# Patient Record
Sex: Female | Born: 1950 | Race: White | Hispanic: No | Smoking: Former smoker
Health system: Southern US, Community
[De-identification: ages and names within clinical notes are randomized; demographics above are authoritative.]

## PROBLEM LIST (undated history)

## (undated) DIAGNOSIS — E039 Hypothyroidism, unspecified: Secondary | ICD-10-CM

## (undated) DIAGNOSIS — I10 Essential (primary) hypertension: Secondary | ICD-10-CM

## (undated) HISTORY — DX: Hypothyroidism, unspecified: E03.9

## (undated) HISTORY — DX: Essential (primary) hypertension: I10

## (undated) HISTORY — PX: KNEE CARTILAGE SURGERY: SHX688

## (undated) HISTORY — PX: PARTIAL HYSTERECTOMY: SHX80

---

## 2020-08-29 LAB — TSH: TSH: 29.7 — AB (ref 0.41–5.90)

## 2020-10-20 LAB — TSH: TSH: 20.1 — AB (ref 0.41–5.90)

## 2020-11-21 LAB — HEMOGLOBIN A1C: Hemoglobin A1C: 5.9

## 2020-11-21 LAB — TSH: TSH: 9.65 — AB (ref 0.41–5.90)

## 2020-11-21 NOTE — Patient Instructions (Signed)

## 2020-11-22 ENCOUNTER — Encounter: Payer: Self-pay | Admitting: Nurse Practitioner

## 2020-11-22 ENCOUNTER — Other Ambulatory Visit: Payer: Self-pay

## 2020-11-22 ENCOUNTER — Ambulatory Visit (INDEPENDENT_AMBULATORY_CARE_PROVIDER_SITE_OTHER): Payer: BC Managed Care – PPO | Admitting: Nurse Practitioner

## 2020-11-22 VITALS — BP 138/78 | HR 71 | Ht 66.5 in | Wt 227.4 lb

## 2020-11-22 DIAGNOSIS — E038 Other specified hypothyroidism: Secondary | ICD-10-CM

## 2020-11-22 DIAGNOSIS — E063 Autoimmune thyroiditis: Secondary | ICD-10-CM

## 2020-11-22 MED ORDER — LEVOTHYROXINE SODIUM 75 MCG PO TABS: 75.0000 ug | ORAL_TABLET | Freq: Every day | ORAL | 0 refills | Status: DC

## 2020-11-22 NOTE — Addendum Note (Signed)
Addended by: Dani Gobble on: 11/22/2020 10:44 AM   Modules accepted: Orders

## 2020-11-22 NOTE — Progress Notes (Addendum)
Endocrinology Consult Note                                         11/22/2020, 10:42 AM  Subjective:   Subjective    Debbie Morrison is a 70 y.o.-year-old female patient being seen in consultation for hypothyroidism referred by Tacy Learn, FNP.   Past Medical History:  Diagnosis Date   Hypertension    Hypothyroidism     Past Surgical History:  Procedure Laterality Date   KNEE CARTILAGE SURGERY     PARTIAL HYSTERECTOMY      Social History   Socioeconomic History   Marital status: Divorced    Spouse name: Not on file   Number of children: Not on file   Years of education: Not on file   Highest education level: Not on file  Occupational History   Not on file  Tobacco Use   Smoking status: Former    Types: Cigarettes    Quit date: 06/2015    Years since quitting: 5.4   Smokeless tobacco: Never  Vaping Use   Vaping Use: Never used  Substance and Sexual Activity   Alcohol use: Never   Drug use: Never   Sexual activity: Not on file  Other Topics Concern   Not on file  Social History Narrative   Not on file   Social Determinants of Health   Financial Resource Strain: Not on file  Food Insecurity: Not on file  Transportation Needs: Not on file  Physical Activity: Not on file  Stress: Not on file  Social Connections: Not on file    Family History  Problem Relation Age of Onset   Cancer Mother    Thyroid disease Mother    Heart attack Mother     Outpatient Encounter Medications as of 11/22/2020  Medication Sig   levothyroxine (SYNTHROID) 75 MCG tablet Take 1 tablet (75 mcg total) by mouth daily.   lisinopril (ZESTRIL) 10 MG tablet Take 10 mg by mouth daily.   meloxicam (MOBIC) 15 MG tablet Take 15 mg by mouth daily.   [DISCONTINUED] levothyroxine (SYNTHROID) 50 MCG tablet Take 1 tablet by mouth daily.   No facility-administered encounter medications on file as of 11/22/2020.     ALLERGIES: No Known Allergies VACCINATION STATUS:  There is no immunization history on file for this patient.   HPI   Debbie Morrison  is a patient with the above medical history. she was diagnosed with hypothyroidism at approximate age of 51 years during a routine physical exam with labs, which required subsequent initiation of thyroid hormone replacement. she was given various doses of Levothyroxine, currently on 50 micrograms. she reports compliance to this medication:  Taking it daily on empty stomach  with water, separated by >30 minutes before breakfast and other medications, and by at least 4 hours from calcium, iron, PPIs, multivitamins .  I reviewed patient's thyroid tests:  Lab Results  Component Value Date   TSH 9.65 (A) 11/21/2020   TSH 20.10 (A) 10/20/2020  TSH 29.70 (A) 08/29/2020     Pt describes vague symptoms of : - weight gain - fatigue   Pt denies feeling nodules in neck, hoarseness, dysphagia/odynophagia, SOB with lying down.  she does family history of thyroid disorders in her mother (? Tumors on her thyroid) and brother (hypothyroidism).  ? family history of thyroid cancer in her mother.  No history of radiation therapy to head or neck.  No recent use of iodine supplements.  Denies use of Biotin containing supplements.  I reviewed her chart and she also has a history of pre diabetes.   ROS:  Constitutional: + weight gain (attributed it to quitting smoking), + fatigue, no subjective hyperthermia, no subjective hypothermia Eyes: no blurry vision, no xerophthalmia ENT: no sore throat, no nodules palpated in throat, no dysphagia/odynophagia, no hoarseness Cardiovascular: no chest pain, no SOB, no palpitations, no leg swelling Respiratory: no cough, no SOB Gastrointestinal: no nausea/vomiting/diarrhea Musculoskeletal: no muscle/joint aches Skin: no rashes Neurological: no tremors, no numbness, no tingling, no dizziness Psychiatric: no depression, no  anxiety   Objective:   Objective     BP 138/78   Pulse 71   Ht 5' 6.5" (1.689 m)   Wt 227 lb 6.4 oz (103.1 kg)   BMI 36.15 kg/m  Wt Readings from Last 3 Encounters:  11/22/20 227 lb 6.4 oz (103.1 kg)    BP Readings from Last 3 Encounters:  11/22/20 138/78     Constitutional:  Body mass index is 36.15 kg/m., not in acute distress, normal state of mind Eyes: PERRLA, EOMI, no exophthalmos ENT: moist mucous membranes, mild thyromegaly, no palpable nodules, no cervical lymphadenopathy Cardiovascular: normal precordial activity, RRR, no murmur/rubs/gallops Respiratory:  adequate breathing efforts, no gross chest deformity, Clear to auscultation bilaterally Gastrointestinal: abdomen soft, non-tender, no distension, bowel sounds present Musculoskeletal: no gross deformities, strength intact in all four extremities Skin: moist, warm, no rashes Neurological: no tremor with outstretched hands, deep tendon reflexes normal in BLE.   CMP ( most recent) CMP  No results found for: NA, K, CL, CO2, GLUCOSE, BUN, CREATININE, CALCIUM, PROT, ALBUMIN, AST, ALT, ALKPHOS, BILITOT, GFRNONAA, GFRAA   Diabetic Labs (most recent): Lab Results  Component Value Date   HGBA1C 5.9 11/21/2020     Lipid Panel ( most recent) Lipid Panel  No results found for: CHOL, TRIG, HDL, CHOLHDL, VLDL, LDLCALC, LDLDIRECT, LABVLDL     Lab Results  Component Value Date   TSH 9.65 (A) 11/21/2020   TSH 20.10 (A) 10/20/2020   TSH 29.70 (A) 08/29/2020    10/20/20 Thyroid labs Thyroglobulin antibodies: 1600.6 TPO antibodies: 527 TSH 20.1 FT4-0.66 FT3-2.0   Assessment & Plan:   ASSESSMENT / PLAN:  1. Hypothyroidism r/t Hashimoto's Thyroiditis   Patient with newly diagnosed hypothyroidism, on Levothyroxine therapy. On physical exam, patient does not have gross goiter, thyroid nodules, or neck compression symptoms.  Her antibody testing was positive, indicating the etiology of her thyroid dysfunction as  autoimmune. Based on her previsit thyroid function tests, she will need more thyroid hormone.  She is advised to increase her dose of Levothyroxine to 75 mcg po daily before breakfast.  She was advised the can take 1.5 tabs of her current 50 mcg pills until she depletes her current supply.  - We discussed about correct intake of levothyroxine, at fasting, with water, separated by at least 30 minutes from breakfast, and separated by more than 4 hours from calcium, iron, multivitamins, acid reflux medications (PPIs). -Patient is made aware of  the fact that thyroid hormone replacement is needed for life, dose to be adjusted by periodic monitoring of thyroid function tests.   -Due to family history of ? Thyroid tumors, will obtain thyroid ultrasound to assess baseline thyroid anatomy.    - Time spent with the patient: 45 minutes, of which >50% was spent in obtaining information about her symptoms, reviewing her previous labs, evaluations, and treatments, counseling her about her hypothyroidism, and developing a plan to confirm the diagnosis and long term treatment as necessary. Please refer to "Patient Self Inventory" in the Media tab for reviewed elements of pertinent patient history.  Debbie Morrison participated in the discussions, expressed understanding, and voiced agreement with the above plans.  All questions were answered to her satisfaction. she is encouraged to contact clinic should she have any questions or concerns prior to her return visit.   FOLLOW UP PLAN:  Return in about 2 weeks (around 12/06/2020) for Thyroid follow up, thyroid ultrasound.  Ronny Bacon, [facility] [address] [place], [place] [address] Phone: [phone] Fax: [phone]  11/22/2020, 10:42 AM

## 2020-12-02 ENCOUNTER — Other Ambulatory Visit: Payer: Self-pay

## 2020-12-02 ENCOUNTER — Ambulatory Visit (HOSPITAL_COMMUNITY)
Admission: RE | Admit: 2020-12-02 | Discharge: 2020-12-02 | Disposition: A | Discharge status: Home / Self Care | Payer: BC Managed Care – PPO | Source: Ambulatory Visit | Attending: Nurse Practitioner | Admitting: Nurse Practitioner

## 2020-12-02 DIAGNOSIS — E038 Other specified hypothyroidism: Secondary | ICD-10-CM | POA: Insufficient documentation

## 2020-12-02 DIAGNOSIS — E063 Autoimmune thyroiditis: Secondary | ICD-10-CM | POA: Insufficient documentation

## 2020-12-09 ENCOUNTER — Ambulatory Visit: Payer: BC Managed Care – PPO | Admitting: Nurse Practitioner

## 2020-12-09 ENCOUNTER — Encounter: Payer: Self-pay | Admitting: Nurse Practitioner

## 2020-12-09 VITALS — BP 141/83 | HR 73 | Ht 66.5 in | Wt 223.0 lb

## 2020-12-09 DIAGNOSIS — E038 Other specified hypothyroidism: Secondary | ICD-10-CM

## 2020-12-09 DIAGNOSIS — E063 Autoimmune thyroiditis: Secondary | ICD-10-CM

## 2020-12-09 MED ORDER — LEVOTHYROXINE SODIUM 88 MCG PO TABS: 88.0000 ug | ORAL_TABLET | Freq: Every day | ORAL | 0 refills | Status: DC

## 2020-12-09 NOTE — Patient Instructions (Signed)

## 2020-12-09 NOTE — Progress Notes (Signed)
Endocrinology Follow Up Note                                         12/09/2020, 10:19 AM  Subjective:   Subjective    Debbie Morrison is a 70 y.o.-year-old female patient being seen in follow up after being seen in consultation for hypothyroidism referred by Tacy Learn, FNP.   Past Medical History:  Diagnosis Date   Hypertension    Hypothyroidism     Past Surgical History:  Procedure Laterality Date   KNEE CARTILAGE SURGERY     PARTIAL HYSTERECTOMY      Social History   Socioeconomic History   Marital status: Divorced    Spouse name: Not on file   Number of children: Not on file   Years of education: Not on file   Highest education level: Not on file  Occupational History   Not on file  Tobacco Use   Smoking status: Former    Types: Cigarettes    Quit date: 06/2015    Years since quitting: 5.5   Smokeless tobacco: Never  Vaping Use   Vaping Use: Never used  Substance and Sexual Activity   Alcohol use: Never   Drug use: Never   Sexual activity: Not on file  Other Topics Concern   Not on file  Social History Narrative   Not on file   Social Determinants of Health   Financial Resource Strain: Not on file  Food Insecurity: Not on file  Transportation Needs: Not on file  Physical Activity: Not on file  Stress: Not on file  Social Connections: Not on file    Family History  Problem Relation Age of Onset   Cancer Mother    Thyroid disease Mother    Heart attack Mother     Outpatient Encounter Medications as of 12/09/2020  Medication Sig   levothyroxine (SYNTHROID) 88 MCG tablet Take 1 tablet (88 mcg total) by mouth daily before breakfast.   lisinopril (ZESTRIL) 10 MG tablet Take 10 mg by mouth daily.   meloxicam (MOBIC) 15 MG tablet Take 15 mg by mouth daily.   [DISCONTINUED] levothyroxine (SYNTHROID) 75 MCG tablet Take 1 tablet (75 mcg total) by mouth daily.   No  facility-administered encounter medications on file as of 12/09/2020.    ALLERGIES: No Known Allergies VACCINATION STATUS:  There is no immunization history on file for this patient.   HPI   Debbie Morrison  is a patient with the above medical history. she was diagnosed with hypothyroidism at approximate age of 89 years during a routine physical exam with labs, which required subsequent initiation of thyroid hormone replacement. she was given various doses of Levothyroxine, currently on 50 micrograms. she reports compliance to this medication:  Taking it daily on empty stomach  with water, separated by >30 minutes before breakfast and other medications, and by at least 4 hours from calcium, iron, PPIs, multivitamins .  I reviewed patient's thyroid tests:  Lab Results  Component Value Date  TSH 9.65 (A) 11/21/2020   TSH 20.10 (A) 10/20/2020   TSH 29.70 (A) 08/29/2020     Pt describes vague symptoms of : - weight gain - fatigue   Pt denies feeling nodules in neck, hoarseness, dysphagia/odynophagia, SOB with lying down.  she does family history of thyroid disorders in her mother (? Tumors on her thyroid) and brother (hypothyroidism).  ? family history of thyroid cancer in her mother.  No history of radiation therapy to head or neck.  No recent use of iodine supplements.  Denies use of Biotin containing supplements.  I reviewed her chart and she also has a history of pre diabetes.   ROS:  Constitutional: + weight gain (attributed it to quitting smoking)-has recently lost some she previously gained, + fatigue, no subjective hyperthermia, no subjective hypothermia Eyes: no blurry vision, no xerophthalmia ENT: no sore throat, no nodules palpated in throat, no dysphagia/odynophagia, no hoarseness Cardiovascular: no chest pain, no SOB, no palpitations, no leg swelling Respiratory: no cough, no SOB Gastrointestinal: no nausea/vomiting/diarrhea Musculoskeletal: no muscle/joint  aches Skin: no rashes Neurological: no tremors, no numbness, no tingling, no dizziness Psychiatric: no depression, no anxiety   Objective:   Objective     BP (!) 141/83   Pulse 73   Ht 5' 6.5" (1.689 m)   Wt 223 lb (101.2 kg)   BMI 35.45 kg/m  Wt Readings from Last 3 Encounters:  12/09/20 223 lb (101.2 kg)  11/22/20 227 lb 6.4 oz (103.1 kg)    BP Readings from Last 3 Encounters:  12/09/20 (!) 141/83  11/22/20 138/78      Physical Exam- Limited  Constitutional:  Body mass index is 35.45 kg/m. , not in acute distress, normal state of mind Eyes:  EOMI, no exophthalmos Neck: Supple Cardiovascular: RRR, no murmurs, rubs, or gallops, no edema Respiratory: Adequate breathing efforts, no crackles, rales, rhonchi, or wheezing Musculoskeletal: no gross deformities, strength intact in all four extremities, no gross restriction of joint movements Skin:  no rashes, no hyperemia Neurological: no tremor with outstretched hands   CMP ( most recent) CMP  No results found for: NA, K, CL, CO2, GLUCOSE, BUN, CREATININE, CALCIUM, PROT, ALBUMIN, AST, ALT, ALKPHOS, BILITOT, GFRNONAA, GFRAA   Diabetic Labs (most recent): Lab Results  Component Value Date   HGBA1C 5.9 11/21/2020     Lipid Panel ( most recent) Lipid Panel  No results found for: CHOL, TRIG, HDL, CHOLHDL, VLDL, LDLCALC, LDLDIRECT, LABVLDL     Lab Results  Component Value Date   TSH 9.65 (A) 11/21/2020   TSH 20.10 (A) 10/20/2020   TSH 29.70 (A) 08/29/2020    10/20/20 Thyroid labs Thyroglobulin antibodies: 1600.6 TPO antibodies: 527 TSH 20.1 FT4-0.66 FT3-2.0   11/21/20 0000   Result status: Final  Resulting lab: OTHER  Reference range: 0.41 - 5.90  Value: 9.65 Abnormal    Comment: FT4-0.89    Thyroid [place] from 12/02/20 CLINICAL DATA:  Hypothyroidism   EXAM: THYROID ULTRASOUND   TECHNIQUE: Ultrasound examination of the thyroid gland and adjacent soft tissues was performed.   COMPARISON:  None.    FINDINGS: Parenchymal Echotexture: Moderately heterogeneous   Isthmus: At 0.3 cm   Right lobe: 5.0 x 2.3 x 1.4 cm   Left lobe: 6.6 x 2.8 x 2.2 cm   _________________________________________________________   Estimated total number of nodules >/= 1 cm: 6   Number of spongiform nodules >/=  2 cm not described below (TR1): 0   Number of mixed cystic and solid  nodules >/= 1.5 cm not described below (TR2): 0   _________________________________________________________   Nodule # 1:   Location: Right; mid   Maximum size: 1.2 cm; Other 2 dimensions: 1.1 x 1.0 cm   Composition: solid/almost completely solid (2)   Echogenicity: hypoechoic (2)   Shape: not taller-than-wide (0)   Margins: ill-defined (0)   Echogenic foci: macrocalcifications (1)   ACR TI-RADS total points: 5.   ACR TI-RADS risk category: TR4 (4-6 points).   ACR TI-RADS recommendations:   *Given size (>/= 1 - 1.4 cm) and appearance, a follow-up ultrasound in 1 year should be considered based on TI-RADS criteria.   _________________________________________________________   Nodule # 2:   Location: Right; inferior   Maximum size: 1.3 cm; Other 2 dimensions: 0.7 x 0.6 cm   Composition: solid/almost completely solid (2)   Echogenicity: hyperechoic (1)   Shape: not taller-than-wide (0)   Margins: ill-defined (0)   Echogenic foci: none (0)   ACR TI-RADS total points: 3.   ACR TI-RADS risk category: TR3 (3 points).   ACR TI-RADS recommendations:   Given size (<1.4 cm) and appearance, this nodule does NOT meet TI-RADS criteria for biopsy or dedicated follow-up.   _________________________________________________________   Nodule # 3:   Location: Right; Inferior   Maximum size: 1.0 cm; Other 2 dimensions: 0.6 x 0.7 cm   Composition: solid/almost completely solid (2)   Echogenicity: hyperechoic (1)   Shape: not taller-than-wide (0)   Margins: smooth (0)   Echogenic foci: none (0)    ACR TI-RADS total points: 3.   ACR TI-RADS risk category: TR3 (3 points).   ACR TI-RADS recommendations:   Given size (<1.4 cm) and appearance, this nodule does NOT meet TI-RADS criteria for biopsy or dedicated follow-up.   _________________________________________________________   Nodule # 4: 1.8 x 1.2 x 1.6 cm hypoechoic region in the superior left thyroid lobe does not have distinct margins. This is favored to be a pseudo nodule within an area of goiter.   _________________________________________________________   Nodule # 5:   Location: Left; mid   Maximum size: 1.8 cm; Other 2 dimensions: 1.1 x 1.2 cm   Composition: solid/almost completely solid (2)   Echogenicity: hyperechoic (1)   Shape: not taller-than-wide (0)   Margins: ill-defined (0)   Echogenic foci: none (0)   ACR TI-RADS total points: 3.   ACR TI-RADS risk category: TR3 (3 points).   ACR TI-RADS recommendations:   *Given size (>/= 1.5 - 2.4 cm) and appearance, a follow-up ultrasound in 1 year should be considered based on TI-RADS criteria.   _________________________________________________________   Nodule # 6:   Location: Left; inferior   Maximum size: 1.7 cm; Other 2 dimensions: 1.5 x 1.6 cm   Composition: solid/almost completely solid (2)   Echogenicity: hyperechoic (1)   Shape: not taller-than-wide (0)   Margins: ill-defined (0)   Echogenic foci: none (0)   ACR TI-RADS total points: 3.   ACR TI-RADS risk category: TR3 (3 points).   ACR TI-RADS recommendations:   *Given size (>/= 1.5 - 2.4 cm) and appearance, a follow-up ultrasound in 1 year should be considered based on TI-RADS criteria.   _________________________________________________________   IMPRESSION: Nodules 1, 5, and 6 meet criteria for imaging surveillance.   Annual ultrasound surveillance is recommended until 5 years of stability is documented.   The above is in keeping with the ACR TI-RADS  recommendations - J Am Coll Radiol 2017;14:587-595.     Electronically Signed   By:  Mauri Reading  Mir M.D.   On: 12/02/2020 13:40  Assessment & Plan:   ASSESSMENT / PLAN:  1. Hypothyroidism r/t Hashimoto's Thyroiditis   -Her previsit thyroid function tests are consistent with under-replacement.  She is advised to increase her Levothyroxine to 88 mcg po daily before breakfast.    - We discussed about correct intake of levothyroxine, at fasting, with water, separated by at least 30 minutes from breakfast, and separated by more than 4 hours from calcium, iron, multivitamins, acid reflux medications (PPIs). -Patient is made aware of the fact that thyroid hormone replacement is needed for life, dose to be adjusted by periodic monitoring of thyroid function tests.   2 Multinodular goiter  Her thyroid ultrasound was consistent with multinodular goiter with 3 moderately suspicious nodules recommending follow up ultrasound in 1 year for surveillance.    I spent 30 minutes in the care of the patient today including review of labs from Thyroid Function, CMP, and other relevant labs ; imaging/biopsy records (current and previous including abstractions from other facilities); face-to-face time discussing  her lab results and symptoms, medications doses, her options of short and long term treatment based on the latest standards of care / guidelines;   and documenting the encounter.  Loreal Schuessler  participated in the discussions, expressed understanding, and voiced agreement with the above plans.  All questions were answered to her satisfaction. she is encouraged to contact clinic should she have any questions or concerns prior to her return visit.   FOLLOW UP PLAN:  Return in about 7 weeks (around 01/27/2021) for Thyroid follow up, Previsit labs.  Ronny Bacon, [facility] [address] [place], [place] [address] Phone: [phone] Fax:  [phone]  12/09/2020, 10:19 AM

## 2020-12-23 ENCOUNTER — Ambulatory Visit: Payer: BC Managed Care – PPO | Admitting: Nurse Practitioner

## 2021-01-25 LAB — T4, FREE: Free T4: 1.38 ng/dL (ref 0.82–1.77)

## 2021-01-25 LAB — TSH: TSH: 2.1 u[IU]/mL (ref 0.450–4.500)

## 2021-01-27 NOTE — Patient Instructions (Signed)

## 2021-01-30 ENCOUNTER — Other Ambulatory Visit: Payer: Self-pay

## 2021-01-30 ENCOUNTER — Encounter: Payer: Self-pay | Admitting: Nurse Practitioner

## 2021-01-30 ENCOUNTER — Ambulatory Visit: Payer: BC Managed Care – PPO | Admitting: Nurse Practitioner

## 2021-01-30 VITALS — BP 123/75 | HR 74 | Ht 66.5 in | Wt 212.4 lb

## 2021-01-30 DIAGNOSIS — E063 Autoimmune thyroiditis: Secondary | ICD-10-CM | POA: Diagnosis not present

## 2021-01-30 DIAGNOSIS — E038 Other specified hypothyroidism: Secondary | ICD-10-CM

## 2021-01-30 MED ORDER — LEVOTHYROXINE SODIUM 88 MCG PO TABS: 88.0000 ug | ORAL_TABLET | Freq: Every day | ORAL | 2 refills | Status: DC

## 2021-01-30 NOTE — Progress Notes (Signed)
Endocrinology Follow Up Note                                         01/30/2021, 10:38 AM  Subjective:   Subjective    Debbie Morrison is a 70 y.o.-year-old female patient being seen in follow up after being seen in consultation for hypothyroidism referred by Tacy Learn, FNP.   Past Medical History:  Diagnosis Date   Hypertension    Hypothyroidism     Past Surgical History:  Procedure Laterality Date   KNEE CARTILAGE SURGERY     PARTIAL HYSTERECTOMY      Social History   Socioeconomic History   Marital status: Divorced    Spouse name: Not on file   Number of children: Not on file   Years of education: Not on file   Highest education level: Not on file  Occupational History   Not on file  Tobacco Use   Smoking status: Former    Types: Cigarettes    Quit date: 06/2015    Years since quitting: 5.6   Smokeless tobacco: Never  Vaping Use   Vaping Use: Never used  Substance and Sexual Activity   Alcohol use: Never   Drug use: Never   Sexual activity: Not on file  Other Topics Concern   Not on file  Social History Narrative   Not on file   Social Determinants of Health   Financial Resource Strain: Not on file  Food Insecurity: Not on file  Transportation Needs: Not on file  Physical Activity: Not on file  Stress: Not on file  Social Connections: Not on file    Family History  Problem Relation Age of Onset   Cancer Mother    Thyroid disease Mother    Heart attack Mother     Outpatient Encounter Medications as of 01/30/2021  Medication Sig   levothyroxine (SYNTHROID) 88 MCG tablet Take 1 tablet (88 mcg total) by mouth daily before breakfast.   lisinopril (ZESTRIL) 10 MG tablet Take 10 mg by mouth daily.   lisinopril (ZESTRIL) 10 MG tablet TAKE 1 TABLET BY MOUTH EVERY DAY FOR 30 DAYS (Patient not taking: Reported on 01/30/2021)   meloxicam (MOBIC) 15 MG tablet Take 15 mg by mouth  daily. (Patient not taking: Reported on 01/30/2021)   No facility-administered encounter medications on file as of 01/30/2021.    ALLERGIES: No Known Allergies VACCINATION STATUS:  There is no immunization history on file for this patient.   HPI   Debbie Morrison  is a patient with the above medical history. she was diagnosed with hypothyroidism at approximate age of 22 years during a routine physical exam with labs, which required subsequent initiation of thyroid hormone replacement. she was given various doses of Levothyroxine, currently on 88 micrograms. she reports compliance to this medication:  Taking it daily on empty stomach  with water, separated by >30 minutes before breakfast and other medications, and by at least 4 hours from calcium, iron, PPIs, multivitamins .  I  reviewed patient's thyroid tests:  Lab Results  Component Value Date   TSH 2.100 01/24/2021   TSH 9.65 (A) 11/21/2020   TSH 20.10 (A) 10/20/2020   TSH 29.70 (A) 08/29/2020   FREET4 1.38 01/24/2021      Pt denies feeling nodules in neck, hoarseness, dysphagia/odynophagia, SOB with lying down.  she does family history of thyroid disorders in her mother (? Tumors on her thyroid) and brother (hypothyroidism).  ? family history of thyroid cancer in her mother.  No history of radiation therapy to head or neck.  No recent use of iodine supplements.  Denies use of Biotin containing supplements.  I reviewed her chart and she also has a history of pre diabetes.   Review of systems  Constitutional: + Minimally fluctuating body weight,  current Body mass index is 33.77 kg/m. ,+ fatigue-improving, no subjective hyperthermia, no subjective hypothermia Eyes: no blurry vision, no xerophthalmia ENT: no sore throat, no nodules palpated in throat, no dysphagia/odynophagia, no hoarseness Cardiovascular: no chest pain, no shortness of breath, no palpitations, no leg swelling Respiratory: no cough, no shortness of  breath Gastrointestinal: no nausea/vomiting/diarrhea Musculoskeletal: no muscle/joint aches Skin: no rashes, no hyperemia Neurological: no tremors, no numbness, no tingling, no dizziness Psychiatric: no depression, no anxiety   Objective:   Objective     BP 123/75   Pulse 74   Ht 5' 6.5" (1.689 m)   Wt 212 lb 6.4 oz (96.3 kg)   BMI 33.77 kg/m  Wt Readings from Last 3 Encounters:  01/30/21 212 lb 6.4 oz (96.3 kg)  12/09/20 223 lb (101.2 kg)  11/22/20 227 lb 6.4 oz (103.1 kg)    BP Readings from Last 3 Encounters:  01/30/21 123/75  12/09/20 (!) 141/83  11/22/20 138/78      Physical Exam- Limited  Constitutional:  Body mass index is 33.77 kg/m. , not in acute distress, normal state of mind Eyes:  EOMI, no exophthalmos Neck: Supple Cardiovascular: RRR, no murmurs, rubs, or gallops, no edema Respiratory: Adequate breathing efforts, no crackles, rales, rhonchi, or wheezing Musculoskeletal: no gross deformities, strength intact in all four extremities, no gross restriction of joint movements Skin:  no rashes, no hyperemia Neurological: no tremor with outstretched hands   CMP ( most recent) CMP  No results found for: NA, K, CL, CO2, GLUCOSE, BUN, CREATININE, CALCIUM, PROT, ALBUMIN, AST, ALT, ALKPHOS, BILITOT, GFRNONAA, GFRAA   Diabetic Labs (most recent): Lab Results  Component Value Date   HGBA1C 5.9 11/21/2020     Lipid Panel ( most recent) Lipid Panel  No results found for: CHOL, TRIG, HDL, CHOLHDL, VLDL, LDLCALC, LDLDIRECT, LABVLDL     Lab Results  Component Value Date   TSH 2.100 01/24/2021   TSH 9.65 (A) 11/21/2020   TSH 20.10 (A) 10/20/2020   TSH 29.70 (A) 08/29/2020   FREET4 1.38 01/24/2021    10/20/20 Thyroid labs Thyroglobulin antibodies: 1600.6 TPO antibodies: 527 TSH 20.1 FT4-0.66 FT3-2.0   11/21/20 0000   Result status: Final  Resulting lab: OTHER  Reference range: 0.41 - 5.90  Value: 9.65 Abnormal    Comment: FT4-0.89     Thyroid US from 12/02/20 CLINICAL DATA:  Hypothyroidism   EXAM: THYROID ULTRASOUND   TECHNIQUE: Ultrasound examination of the thyroid gland and adjacent soft tissues was performed.   COMPARISON:  None.   FINDINGS: Parenchymal Echotexture: Moderately heterogeneous   Isthmus: At 0.3 cm   Right lobe: 5.0 x 2.3 x 1.4 cm   Left lobe: 6.6 x 2.8  x 2.2 cm   _________________________________________________________   Estimated total number of nodules >/= 1 cm: 6   Number of spongiform nodules >/=  2 cm not described below (TR1): 0   Number of mixed cystic and solid nodules >/= 1.5 cm not described below (TR2): 0   _________________________________________________________   Nodule # 1:   Location: Right; mid   Maximum size: 1.2 cm; Other 2 dimensions: 1.1 x 1.0 cm   Composition: solid/almost completely solid (2)   Echogenicity: hypoechoic (2)   Shape: not taller-than-wide (0)   Margins: ill-defined (0)   Echogenic foci: macrocalcifications (1)   ACR TI-RADS total points: 5.   ACR TI-RADS risk category: TR4 (4-6 points).   ACR TI-RADS recommendations:   *Given size (>/= 1 - 1.4 cm) and appearance, a follow-up ultrasound in 1 year should be considered based on TI-RADS criteria.   _________________________________________________________   Nodule # 2:   Location: Right; inferior   Maximum size: 1.3 cm; Other 2 dimensions: 0.7 x 0.6 cm   Composition: solid/almost completely solid (2)   Echogenicity: hyperechoic (1)   Shape: not taller-than-wide (0)   Margins: ill-defined (0)   Echogenic foci: none (0)   ACR TI-RADS total points: 3.   ACR TI-RADS risk category: TR3 (3 points).   ACR TI-RADS recommendations:   Given size (<1.4 cm) and appearance, this nodule does NOT meet TI-RADS criteria for biopsy or dedicated follow-up.   _________________________________________________________   Nodule # 3:   Location: Right; Inferior   Maximum size:  1.0 cm; Other 2 dimensions: 0.6 x 0.7 cm   Composition: solid/almost completely solid (2)   Echogenicity: hyperechoic (1)   Shape: not taller-than-wide (0)   Margins: smooth (0)   Echogenic foci: none (0)   ACR TI-RADS total points: 3.   ACR TI-RADS risk category: TR3 (3 points).   ACR TI-RADS recommendations:   Given size (<1.4 cm) and appearance, this nodule does NOT meet TI-RADS criteria for biopsy or dedicated follow-up.   _________________________________________________________   Nodule # 4: 1.8 x 1.2 x 1.6 cm hypoechoic region in the superior left thyroid lobe does not have distinct margins. This is favored to be a pseudo nodule within an area of goiter.   _________________________________________________________   Nodule # 5:   Location: Left; mid   Maximum size: 1.8 cm; Other 2 dimensions: 1.1 x 1.2 cm   Composition: solid/almost completely solid (2)   Echogenicity: hyperechoic (1)   Shape: not taller-than-wide (0)   Margins: ill-defined (0)   Echogenic foci: none (0)   ACR TI-RADS total points: 3.   ACR TI-RADS risk category: TR3 (3 points).   ACR TI-RADS recommendations:   *Given size (>/= 1.5 - 2.4 cm) and appearance, a follow-up ultrasound in 1 year should be considered based on TI-RADS criteria.   _________________________________________________________   Nodule # 6:   Location: Left; inferior   Maximum size: 1.7 cm; Other 2 dimensions: 1.5 x 1.6 cm   Composition: solid/almost completely solid (2)   Echogenicity: hyperechoic (1)   Shape: not taller-than-wide (0)   Margins: ill-defined (0)   Echogenic foci: none (0)   ACR TI-RADS total points: 3.   ACR TI-RADS risk category: TR3 (3 points).   ACR TI-RADS recommendations:   *Given size (>/= 1.5 - 2.4 cm) and appearance, a follow-up ultrasound in 1 year should be considered based on TI-RADS criteria.   _________________________________________________________    IMPRESSION: Nodules 1, 5, and 6 meet criteria for imaging surveillance.  Annual ultrasound surveillance is recommended until 5 years of stability is documented.   The above is in keeping with the ACR TI-RADS recommendations - J Am Coll Radiol 2017;14:587-595.     Electronically Signed   By: Acquanetta Belling M.D.   On: 12/02/2020 13:40  Assessment & Plan:   ASSESSMENT / PLAN:  1. Hypothyroidism r/t Hashimoto's Thyroiditis   -Her previsit thyroid function tests are consistent with appropriate hormone replacement.  She is advised to continue Levothyroxine 88 mcg po daily before breakfast.   - We discussed about correct intake of levothyroxine, at fasting, with water, separated by at least 30 minutes from breakfast, and separated by more than 4 hours from calcium, iron, multivitamins, acid reflux medications (PPIs). -Patient is made aware of the fact that thyroid hormone replacement is needed for life, dose to be adjusted by periodic monitoring of thyroid function tests.   2 Multinodular goiter  Her thyroid ultrasound was consistent with multinodular goiter with 3 moderately suspicious nodules recommending follow up ultrasound in 1 year for surveillance (around 11/2021).     I spent 20 minutes in the care of the patient today including review of labs from Thyroid Function, CMP, and other relevant labs ; imaging/biopsy records (current and previous including abstractions from other facilities); face-to-face time discussing  her lab results and symptoms, medications doses, her options of short and long term treatment based on the latest standards of care / guidelines;   and documenting the encounter.  Sadee Osland  participated in the discussions, expressed understanding, and voiced agreement with the above plans.  All questions were answered to her satisfaction. she is encouraged to contact clinic should she have any questions or concerns prior to her return visit.   FOLLOW UP  PLAN:  Return in about 3 months (around 05/02/2021) for Thyroid follow up, Previsit labs.  Ronny Bacon, [facility] [address] [place], [place] [address] Phone: [phone] Fax: [phone]  01/30/2021, 10:38 AM

## 2021-05-10 ENCOUNTER — Ambulatory Visit: Payer: BC Managed Care – PPO | Admitting: Nurse Practitioner

## 2021-05-10 LAB — TSH: TSH: 2.44 u[IU]/mL (ref 0.450–4.500)

## 2021-05-10 LAB — T4, FREE: Free T4: 1.27 ng/dL (ref 0.82–1.77)

## 2021-05-17 NOTE — Patient Instructions (Signed)

## 2021-05-18 ENCOUNTER — Other Ambulatory Visit: Payer: Self-pay

## 2021-05-18 ENCOUNTER — Ambulatory Visit: Payer: BC Managed Care – PPO | Admitting: Nurse Practitioner

## 2021-05-18 ENCOUNTER — Encounter: Payer: Self-pay | Admitting: Nurse Practitioner

## 2021-05-18 VITALS — BP 128/72 | HR 75 | Ht 66.5 in | Wt 195.0 lb

## 2021-05-18 DIAGNOSIS — E063 Autoimmune thyroiditis: Secondary | ICD-10-CM | POA: Diagnosis not present

## 2021-05-18 DIAGNOSIS — E038 Other specified hypothyroidism: Secondary | ICD-10-CM

## 2021-05-18 MED ORDER — LEVOTHYROXINE SODIUM 88 MCG PO TABS: 88.0000 ug | ORAL_TABLET | Freq: Every day | ORAL | 3 refills | Status: DC

## 2021-05-18 NOTE — Progress Notes (Signed)
Endocrinology Follow Up Note                                         05/18/2021, 9:40 AM  Subjective:   Subjective    Debbie Morrison is a 71 y.o.-year-old female patient being seen in follow up after being seen in consultation for hypothyroidism referred by Tacy LearnGrabowski, Kristen, FNP.   Past Medical History:  Diagnosis Date   Hypertension    Hypothyroidism     Past Surgical History:  Procedure Laterality Date   KNEE CARTILAGE SURGERY     PARTIAL HYSTERECTOMY      Social History   Socioeconomic History   Marital status: Divorced    Spouse name: Not on file   Number of children: Not on file   Years of education: Not on file   Highest education level: Not on file  Occupational History   Not on file  Tobacco Use   Smoking status: Former    Types: Cigarettes    Quit date: 06/2015    Years since quitting: 5.9   Smokeless tobacco: Never  Vaping Use   Vaping Use: Never used  Substance and Sexual Activity   Alcohol use: Never   Drug use: Never   Sexual activity: Not on file  Other Topics Concern   Not on file  Social History Narrative   Not on file   Social Determinants of Health   Financial Resource Strain: Not on file  Food Insecurity: Not on file  Transportation Needs: Not on file  Physical Activity: Not on file  Stress: Not on file  Social Connections: Not on file    Family History  Problem Relation Age of Onset   Cancer Mother    Thyroid disease Mother    Heart attack Mother     Outpatient Encounter Medications as of 05/18/2021  Medication Sig   acetaminophen (TYLENOL) 500 MG tablet Take 500 mg by mouth as needed.   lisinopril (ZESTRIL) 10 MG tablet Take 10 mg by mouth daily.   [DISCONTINUED] levothyroxine (SYNTHROID) 88 MCG tablet Take 1 tablet (88 mcg total) by mouth daily before breakfast.   levothyroxine (SYNTHROID) 88 MCG tablet Take 1 tablet (88 mcg total) by mouth daily before  breakfast.   No facility-administered encounter medications on file as of 05/18/2021.    ALLERGIES: No Known Allergies VACCINATION STATUS:  There is no immunization history on file for this patient.   HPI   Debbie Morrison  is a patient with the above medical history. she was diagnosed with hypothyroidism at approximate age of 71 years during a routine physical exam with labs, which required subsequent initiation of thyroid hormone replacement. she was given various doses of Levothyroxine, currently on 88 micrograms. she reports compliance to this medication:  Taking it daily on empty stomach  with water, separated by >30 minutes before breakfast and other medications, and by at least 4 hours from calcium, iron, PPIs, multivitamins .  I reviewed patient's thyroid tests:  Lab Results  Component  Value Date   TSH 2.440 05/09/2021   TSH 2.100 01/24/2021   TSH 9.65 (A) 11/21/2020   TSH 20.10 (A) 10/20/2020   TSH 29.70 (A) 08/29/2020   FREET4 1.27 05/09/2021   FREET4 1.38 01/24/2021      Pt denies feeling nodules in neck, hoarseness, dysphagia/odynophagia, SOB with lying down.  she does family history of thyroid disorders in her mother (? Tumors on her thyroid) and brother (hypothyroidism).  ? family history of thyroid cancer in her mother.  No history of radiation therapy to head or neck.  No recent use of iodine supplements.  Denies use of Biotin containing supplements.  I reviewed her chart and she also has a history of pre diabetes.   Review of systems  Constitutional: + drastically decreasing body weight-intentional,  current Body mass index is 31 kg/m. , intermittent fatigue- improving, no subjective hyperthermia, no subjective hypothermia Eyes: no blurry vision, no xerophthalmia ENT: no sore throat, no nodules palpated in throat, no dysphagia/odynophagia, no hoarseness Cardiovascular: no chest pain, no shortness of breath, no palpitations, no leg swelling Respiratory: no  cough, no shortness of breath Gastrointestinal: no nausea/vomiting/diarrhea Musculoskeletal: no muscle/joint aches Skin: no rashes, no hyperemia Neurological: mild intermittent hand tremors, no numbness, no tingling, no dizziness Psychiatric: no depression, no anxiety   Objective:   Objective     BP 128/72    Pulse 75    Ht 5' 6.5" (1.689 m)    Wt 195 lb (88.5 kg)    SpO2 98%    BMI 31.00 kg/m  Wt Readings from Last 3 Encounters:  05/18/21 195 lb (88.5 kg)  01/30/21 212 lb 6.4 oz (96.3 kg)  12/09/20 223 lb (101.2 kg)    BP Readings from Last 3 Encounters:  05/18/21 128/72  01/30/21 123/75  12/09/20 (!) 141/83      Physical Exam- Limited  Constitutional:  Body mass index is 31 kg/m. , not in acute distress, normal state of mind Eyes:  EOMI, no exophthalmos Neck: Supple Cardiovascular: RRR, no murmurs, rubs, or gallops, no edema Respiratory: Adequate breathing efforts, no crackles, rales, rhonchi, or wheezing Musculoskeletal: no gross deformities, strength intact in all four extremities, no gross restriction of joint movements Skin:  no rashes, no hyperemia Neurological: no tremor with outstretched hands   CMP ( most recent) CMP  No results found for: NA, K, CL, CO2, GLUCOSE, BUN, CREATININE, CALCIUM, PROT, ALBUMIN, AST, ALT, ALKPHOS, BILITOT, GFRNONAA, GFRAA   Diabetic Labs (most recent): Lab Results  Component Value Date   HGBA1C 5.9 11/21/2020     Lipid Panel ( most recent) Lipid Panel  No results found for: CHOL, TRIG, HDL, CHOLHDL, VLDL, LDLCALC, LDLDIRECT, LABVLDL     Lab Results  Component Value Date   TSH 2.440 05/09/2021   TSH 2.100 01/24/2021   TSH 9.65 (A) 11/21/2020   TSH 20.10 (A) 10/20/2020   TSH 29.70 (A) 08/29/2020   FREET4 1.27 05/09/2021   FREET4 1.38 01/24/2021    10/20/20 Thyroid labs Thyroglobulin antibodies: 1600.6 TPO antibodies: 527 TSH 20.1 FT4-0.66 FT3-2.0   11/21/20 0000   Result status: Final  Resulting lab: OTHER   Reference range: 0.41 - 5.90  Value: 9.65 Abnormal    Comment: FT4-0.89    Thyroid US from 12/02/20 CLINICAL DATA:  Hypothyroidism   EXAM: THYROID ULTRASOUND   TECHNIQUE: Ultrasound examination of the thyroid gland and adjacent soft tissues was performed.   COMPARISON:  None.   FINDINGS: Parenchymal Echotexture: Moderately heterogeneous   Isthmus:  At 0.3 cm   Right lobe: 5.0 x 2.3 x 1.4 cm   Left lobe: 6.6 x 2.8 x 2.2 cm   _________________________________________________________   Estimated total number of nodules >/= 1 cm: 6   Number of spongiform nodules >/=  2 cm not described below (TR1): 0   Number of mixed cystic and solid nodules >/= 1.5 cm not described below (TR2): 0   _________________________________________________________   Nodule # 1:   Location: Right; mid   Maximum size: 1.2 cm; Other 2 dimensions: 1.1 x 1.0 cm   Composition: solid/almost completely solid (2)   Echogenicity: hypoechoic (2)   Shape: not taller-than-wide (0)   Margins: ill-defined (0)   Echogenic foci: macrocalcifications (1)   ACR TI-RADS total points: 5.   ACR TI-RADS risk category: TR4 (4-6 points).   ACR TI-RADS recommendations:   *Given size (>/= 1 - 1.4 cm) and appearance, a follow-up ultrasound in 1 year should be considered based on TI-RADS criteria.   _________________________________________________________   Nodule # 2:   Location: Right; inferior   Maximum size: 1.3 cm; Other 2 dimensions: 0.7 x 0.6 cm   Composition: solid/almost completely solid (2)   Echogenicity: hyperechoic (1)   Shape: not taller-than-wide (0)   Margins: ill-defined (0)   Echogenic foci: none (0)   ACR TI-RADS total points: 3.   ACR TI-RADS risk category: TR3 (3 points).   ACR TI-RADS recommendations:   Given size (<1.4 cm) and appearance, this nodule does NOT meet TI-RADS criteria for biopsy or dedicated follow-up.    _________________________________________________________   Nodule # 3:   Location: Right; Inferior   Maximum size: 1.0 cm; Other 2 dimensions: 0.6 x 0.7 cm   Composition: solid/almost completely solid (2)   Echogenicity: hyperechoic (1)   Shape: not taller-than-wide (0)   Margins: smooth (0)   Echogenic foci: none (0)   ACR TI-RADS total points: 3.   ACR TI-RADS risk category: TR3 (3 points).   ACR TI-RADS recommendations:   Given size (<1.4 cm) and appearance, this nodule does NOT meet TI-RADS criteria for biopsy or dedicated follow-up.   _________________________________________________________   Nodule # 4: 1.8 x 1.2 x 1.6 cm hypoechoic region in the superior left thyroid lobe does not have distinct margins. This is favored to be a pseudo nodule within an area of goiter.   _________________________________________________________   Nodule # 5:   Location: Left; mid   Maximum size: 1.8 cm; Other 2 dimensions: 1.1 x 1.2 cm   Composition: solid/almost completely solid (2)   Echogenicity: hyperechoic (1)   Shape: not taller-than-wide (0)   Margins: ill-defined (0)   Echogenic foci: none (0)   ACR TI-RADS total points: 3.   ACR TI-RADS risk category: TR3 (3 points).   ACR TI-RADS recommendations:   *Given size (>/= 1.5 - 2.4 cm) and appearance, a follow-up ultrasound in 1 year should be considered based on TI-RADS criteria.   _________________________________________________________   Nodule # 6:   Location: Left; inferior   Maximum size: 1.7 cm; Other 2 dimensions: 1.5 x 1.6 cm   Composition: solid/almost completely solid (2)   Echogenicity: hyperechoic (1)   Shape: not taller-than-wide (0)   Margins: ill-defined (0)   Echogenic foci: none (0)   ACR TI-RADS total points: 3.   ACR TI-RADS risk category: TR3 (3 points).   ACR TI-RADS recommendations:   *Given size (>/= 1.5 - 2.4 cm) and appearance, a follow-up ultrasound in 1 year  should be considered based  on TI-RADS criteria.   _________________________________________________________   IMPRESSION: Nodules 1, 5, and 6 meet criteria for imaging surveillance.   Annual ultrasound surveillance is recommended until 5 years of stability is documented.   The above is in keeping with the ACR TI-RADS recommendations - J Am Coll Radiol 2017;14:587-595.     Electronically Signed   By: Acquanetta Belling M.D.   On: 12/02/2020 13:40  Assessment & Plan:   ASSESSMENT / PLAN:  1. Hypothyroidism r/t Hashimoto's Thyroiditis   -Her previsit thyroid function tests are consistent with appropriate hormone replacement.  She is advised to continue Levothyroxine 88 mcg po daily before breakfast.   - We discussed about correct intake of levothyroxine, at fasting, with water, separated by at least 30 minutes from breakfast, and separated by more than 4 hours from calcium, iron, multivitamins, acid reflux medications (PPIs). -Patient is made aware of the fact that thyroid hormone replacement is needed for life, dose to be adjusted by periodic monitoring of thyroid function tests.   2 Multinodular goiter  Her thyroid ultrasound was consistent with multinodular goiter with 3 moderately suspicious nodules recommending follow up ultrasound in 1 year for surveillance (around 11/2021).  I have ordered for this to be done prior to next visit in 6 months.     I spent 25 minutes in the care of the patient today including review of labs from Thyroid Function, CMP, and other relevant labs ; imaging/biopsy records (current and previous including abstractions from other facilities); face-to-face time discussing  her lab results and symptoms, medications doses, her options of short and long term treatment based on the latest standards of care / guidelines;   and documenting the encounter.  Ardath Lepak  participated in the discussions, expressed understanding, and voiced agreement with the above  plans.  All questions were answered to her satisfaction. she is encouraged to contact clinic should she have any questions or concerns prior to her return visit.   FOLLOW UP PLAN:  Return in about 6 months (around 11/15/2021) for Thyroid follow up, Previsit labs, thyroid ultrasound.  Ronny Bacon, [facility] [address] [place], [place] [address] Phone: [phone] Fax: [phone]  05/18/2021, 9:40 AM

## 2021-10-11 ENCOUNTER — Ambulatory Visit (HOSPITAL_COMMUNITY)
Admission: RE | Admit: 2021-10-11 | Discharge: 2021-10-11 | Disposition: A | Discharge status: Home / Self Care | Payer: BC Managed Care – PPO | Source: Ambulatory Visit | Attending: Nurse Practitioner | Admitting: Nurse Practitioner

## 2021-10-11 DIAGNOSIS — E063 Autoimmune thyroiditis: Secondary | ICD-10-CM | POA: Diagnosis present

## 2021-10-11 DIAGNOSIS — E038 Other specified hypothyroidism: Secondary | ICD-10-CM | POA: Insufficient documentation

## 2021-11-08 LAB — TSH: TSH: 1.16 u[IU]/mL (ref 0.450–4.500)

## 2021-11-08 LAB — T4, FREE: Free T4: 1.49 ng/dL (ref 0.82–1.77)

## 2021-11-15 ENCOUNTER — Ambulatory Visit: Payer: BC Managed Care – PPO | Admitting: Nurse Practitioner

## 2021-11-16 ENCOUNTER — Ambulatory Visit: Payer: BC Managed Care – PPO | Admitting: Nurse Practitioner

## 2021-11-16 ENCOUNTER — Encounter: Payer: Self-pay | Admitting: Nurse Practitioner

## 2021-11-16 VITALS — BP 122/73 | HR 85 | Ht 66.5 in | Wt 172.0 lb

## 2021-11-16 DIAGNOSIS — E038 Other specified hypothyroidism: Secondary | ICD-10-CM

## 2021-11-16 DIAGNOSIS — E063 Autoimmune thyroiditis: Secondary | ICD-10-CM | POA: Diagnosis not present

## 2021-11-16 MED ORDER — LEVOTHYROXINE SODIUM 88 MCG PO TABS: 88.0000 ug | ORAL_TABLET | Freq: Every day | ORAL | 3 refills | Status: DC

## 2021-11-16 NOTE — Progress Notes (Signed)
Endocrinology Follow Up Note                                         11/16/2021, 12:44 PM  Subjective:   Subjective    Debbie Morrison is a 71 y.o.-year-old female patient being seen in follow up after being seen in consultation for hypothyroidism referred by Debbie Learn, FNP.   Past Medical History:  Diagnosis Date   Hypertension    Hypothyroidism     Past Surgical History:  Procedure Laterality Date   KNEE CARTILAGE SURGERY     PARTIAL HYSTERECTOMY      Social History   Socioeconomic History   Marital status: Divorced    Spouse name: Not on file   Number of children: Not on file   Years of education: Not on file   Highest education level: Not on file  Occupational History   Not on file  Tobacco Use   Smoking status: Former    Types: Cigarettes    Quit date: 06/2015    Years since quitting: 6.4   Smokeless tobacco: Never  Vaping Use   Vaping Use: Never used  Substance and Sexual Activity   Alcohol use: Never   Drug use: Never   Sexual activity: Not on file  Other Topics Concern   Not on file  Social History Narrative   Not on file   Social Determinants of Health   Financial Resource Strain: Not on file  Food Insecurity: Not on file  Transportation Needs: Not on file  Physical Activity: Not on file  Stress: Not on file  Social Connections: Not on file    Family History  Problem Relation Age of Onset   Cancer Mother    Thyroid disease Mother    Heart attack Mother     Outpatient Encounter Medications as of 11/16/2021  Medication Sig   acetaminophen (TYLENOL) 500 MG tablet Take 500 mg by mouth as needed.   levothyroxine (SYNTHROID) 88 MCG tablet Take 1 tablet (88 mcg total) by mouth daily before breakfast.   lisinopril (ZESTRIL) 10 MG tablet Take 10 mg by mouth daily.   [DISCONTINUED] levothyroxine (SYNTHROID) 88 MCG tablet Take 1 tablet (88 mcg total) by mouth daily before  breakfast.   No facility-administered encounter medications on file as of 11/16/2021.    ALLERGIES: No Known Allergies VACCINATION STATUS:  There is no immunization history on file for this patient.   HPI   Debbie Morrison  is a patient with the above medical history. she was diagnosed with hypothyroidism at approximate age of 46 years during a routine physical exam with labs, which required subsequent initiation of thyroid hormone replacement. she was given various doses of Levothyroxine, currently on 88 micrograms. she reports compliance to this medication:  Taking it daily on empty stomach  with water, separated by >30 minutes before breakfast and other medications, and by at least 4 hours from calcium, iron, PPIs, multivitamins .  I reviewed patient's thyroid tests:  Lab Results  Component  Value Date   TSH 1.160 11/07/2021   TSH 2.440 05/09/2021   TSH 2.100 01/24/2021   TSH 9.65 (A) 11/21/2020   TSH 20.10 (A) 10/20/2020   TSH 29.70 (A) 08/29/2020   FREET4 1.49 11/07/2021   FREET4 1.27 05/09/2021   FREET4 1.38 01/24/2021      Pt denies feeling nodules in neck, hoarseness, dysphagia/odynophagia, SOB with lying down.  she does family history of thyroid disorders in her mother (? Tumors on her thyroid) and brother (hypothyroidism).  ? family history of thyroid cancer in her mother.  No history of radiation therapy to head or neck.  No recent use of iodine supplements.  Denies use of Biotin containing supplements.  I reviewed her chart and she also has a history of pre diabetes.   Review of systems  Constitutional: + steadily decreasing body weight (intentional),  current Body mass index is 27.35 kg/m. , no fatigue, no subjective hyperthermia, no subjective hypothermia Eyes: no blurry vision, no xerophthalmia ENT: no sore throat, no nodules palpated in throat, no dysphagia/odynophagia, no hoarseness Cardiovascular: no chest pain, no shortness of breath, no palpitations, no  leg swelling Respiratory: no cough, no shortness of breath Gastrointestinal: no nausea/vomiting/diarrhea Musculoskeletal: no muscle/joint aches Skin: no rashes, no hyperemia Neurological: no tremors, no numbness, no tingling, no dizziness Psychiatric: no depression, no anxiety   Objective:   Objective     BP 122/73   Pulse 85   Ht 5' 6.5" (1.689 m)   Wt 172 lb (78 kg)   BMI 27.35 kg/m  Wt Readings from Last 3 Encounters:  11/16/21 172 lb (78 kg)  05/18/21 195 lb (88.5 kg)  01/30/21 212 lb 6.4 oz (96.3 kg)    BP Readings from Last 3 Encounters:  11/16/21 122/73  05/18/21 128/72  01/30/21 123/75      Physical Exam- Limited  Constitutional:  Body mass index is 27.35 kg/m. , not in acute distress, normal state of mind Eyes:  EOMI, no exophthalmos Neck: Supple Cardiovascular: RRR, no murmurs, rubs, or gallops, no edema Respiratory: Adequate breathing efforts, no crackles, rales, rhonchi, or wheezing Musculoskeletal: no gross deformities, strength intact in all four extremities, no gross restriction of joint movements Skin:  no rashes, no hyperemia Neurological: no tremor with outstretched hands   CMP ( most recent) CMP  No results found for: "NA", "K", "CL", "CO2", "GLUCOSE", "BUN", "CREATININE", "CALCIUM", "PROT", "ALBUMIN", "AST", "ALT", "ALKPHOS", "BILITOT", "GFRNONAA", "GFRAA"   Diabetic Labs (most recent): Lab Results  Component Value Date   HGBA1C 5.9 11/21/2020     Lipid Panel ( most recent) Lipid Panel  No results found for: "CHOL", "TRIG", "HDL", "CHOLHDL", "VLDL", "LDLCALC", "LDLDIRECT", "LABVLDL"     Lab Results  Component Value Date   TSH 1.160 11/07/2021   TSH 2.440 05/09/2021   TSH 2.100 01/24/2021   TSH 9.65 (A) 11/21/2020   TSH 20.10 (A) 10/20/2020   TSH 29.70 (A) 08/29/2020   FREET4 1.49 11/07/2021   FREET4 1.27 05/09/2021   FREET4 1.38 01/24/2021    10/20/20 Thyroid labs Thyroglobulin antibodies: 1600.6 TPO antibodies: 527 TSH  20.1 FT4-0.66 FT3-2.0   11/21/20 0000   Result status: Final  Resulting lab: OTHER  Reference range: 0.41 - 5.90  Value: 9.65 Abnormal    Comment: FT4-0.89    Thyroid US from 12/02/20 CLINICAL DATA:  Hypothyroidism   EXAM: THYROID ULTRASOUND   TECHNIQUE: Ultrasound examination of the thyroid gland and adjacent soft tissues was performed.   COMPARISON:  None.  FINDINGS: Parenchymal Echotexture: Moderately heterogeneous   Isthmus: At 0.3 cm   Right lobe: 5.0 x 2.3 x 1.4 cm   Left lobe: 6.6 x 2.8 x 2.2 cm   _________________________________________________________   Estimated total number of nodules >/= 1 cm: 6   Number of spongiform nodules >/=  2 cm not described below (TR1): 0   Number of mixed cystic and solid nodules >/= 1.5 cm not described below (TR2): 0   _________________________________________________________   Nodule # 1:   Location: Right; mid   Maximum size: 1.2 cm; Other 2 dimensions: 1.1 x 1.0 cm   Composition: solid/almost completely solid (2)   Echogenicity: hypoechoic (2)   Shape: not taller-than-wide (0)   Margins: ill-defined (0)   Echogenic foci: macrocalcifications (1)   ACR TI-RADS total points: 5.   ACR TI-RADS risk category: TR4 (4-6 points).   ACR TI-RADS recommendations:   *Given size (>/= 1 - 1.4 cm) and appearance, a follow-up ultrasound in 1 year should be considered based on TI-RADS criteria.   _________________________________________________________   Nodule # 2:   Location: Right; inferior   Maximum size: 1.3 cm; Other 2 dimensions: 0.7 x 0.6 cm   Composition: solid/almost completely solid (2)   Echogenicity: hyperechoic (1)   Shape: not taller-than-wide (0)   Margins: ill-defined (0)   Echogenic foci: none (0)   ACR TI-RADS total points: 3.   ACR TI-RADS risk category: TR3 (3 points).   ACR TI-RADS recommendations:   Given size (<1.4 cm) and appearance, this nodule does NOT meet TI-RADS  criteria for biopsy or dedicated follow-up.   _________________________________________________________   Nodule # 3:   Location: Right; Inferior   Maximum size: 1.0 cm; Other 2 dimensions: 0.6 x 0.7 cm   Composition: solid/almost completely solid (2)   Echogenicity: hyperechoic (1)   Shape: not taller-than-wide (0)   Margins: smooth (0)   Echogenic foci: none (0)   ACR TI-RADS total points: 3.   ACR TI-RADS risk category: TR3 (3 points).   ACR TI-RADS recommendations:   Given size (<1.4 cm) and appearance, this nodule does NOT meet TI-RADS criteria for biopsy or dedicated follow-up.   _________________________________________________________   Nodule # 4: 1.8 x 1.2 x 1.6 cm hypoechoic region in the superior left thyroid lobe does not have distinct margins. This is favored to be a pseudo nodule within an area of goiter.   _________________________________________________________   Nodule # 5:   Location: Left; mid   Maximum size: 1.8 cm; Other 2 dimensions: 1.1 x 1.2 cm   Composition: solid/almost completely solid (2)   Echogenicity: hyperechoic (1)   Shape: not taller-than-wide (0)   Margins: ill-defined (0)   Echogenic foci: none (0)   ACR TI-RADS total points: 3.   ACR TI-RADS risk category: TR3 (3 points).   ACR TI-RADS recommendations:   *Given size (>/= 1.5 - 2.4 cm) and appearance, a follow-up ultrasound in 1 year should be considered based on TI-RADS criteria.   _________________________________________________________   Nodule # 6:   Location: Left; inferior   Maximum size: 1.7 cm; Other 2 dimensions: 1.5 x 1.6 cm   Composition: solid/almost completely solid (2)   Echogenicity: hyperechoic (1)   Shape: not taller-than-wide (0)   Margins: ill-defined (0)   Echogenic foci: none (0)   ACR TI-RADS total points: 3.   ACR TI-RADS risk category: TR3 (3 points).   ACR TI-RADS recommendations:   *Given size (>/= 1.5 - 2.4 cm) and  appearance, a follow-up  ultrasound in 1 year should be considered based on TI-RADS criteria.   _________________________________________________________   IMPRESSION: Nodules 1, 5, and 6 meet criteria for imaging surveillance.   Annual ultrasound surveillance is recommended until 5 years of stability is documented.   The above is in keeping with the ACR TI-RADS recommendations - J Am Coll Radiol 2017;14:587-595.     Electronically Signed   By: Acquanetta Belling M.D.   On: 12/02/2020 13:40   Latest Reference Range & Units 01/24/21 13:18 05/09/21 13:54 11/07/21 13:36  TSH 0.450 - 4.500 uIU/mL 2.100 2.440 1.160  T4,Free(Direct) 0.82 - 1.77 ng/dL 4.88 8.91 6.94   Thyroid US from 10/11/21 CLINICAL DATA:  Prior ultrasound follow-up. Multiple thyroid nodules under imaging surveillance.   EXAM: THYROID ULTRASOUND   TECHNIQUE: Ultrasound examination of the thyroid gland and adjacent soft tissues was performed.   COMPARISON:  Prior thyroid ultrasound 12/02/2020   FINDINGS: Parenchymal Echotexture: Markedly heterogenous   Isthmus: 0.1 cm   Right lobe: 4.9 x 1.4 x 1.4 cm   Left lobe: 5.2 x 1.9 x 2.2 cm   _________________________________________________________   Estimated total number of nodules >/= 1 cm: 4   Number of spongiform nodules >/=  2 cm not described below (TR1): 0   Number of mixed cystic and solid nodules >/= 1.5 cm not described below (TR2): 0   _________________________________________________________   Markedly heterogeneous thyroid gland. The gland is predominantly hypoechoic with echogenic septations running through it. This imaging appearance is most suggestive of chronic Hashimoto's thyroiditis.   Nodule # 1: Decreased size of dystrophic calcification in the right mid gland. This lesion no longer meets criteria to warrant continued imaging follow-up at less than 1 cm in size.   Nodule # 6: Echogenic solid nodule along the deep aspect of the  left inferior gland measures 1.8 x 1.1 x 1.0 cm, similar compared to 1.7 x 1.5 x 1.4 cm previously. This TR 3 nodule continues to meet criteria for imaging surveillance.   Numerous additional areas of pseudo nodularity and small echogenic nodules all appear improved compared to prior imaging and no longer meet criteria for surveillance.   IMPRESSION: 1. Extremely heterogeneous thyroid gland with an imaging appearance most suggestive of chronic Hashimoto's thyroiditis. 2. Interval involution of 2 of the previously identified nodules (# 1 and # 5 on the prior exam) which no longer meet criteria for imaging surveillance. 3. No interval change in the size or appearance of nodule # 6 in the left inferior gland which continues to meet criteria for imaging surveillance. Recommend follow-up ultrasound in 1-2 years.   The above is in keeping with the ACR TI-RADS recommendations - J Am Coll Radiol 2017;14:587-595.     Electronically Signed   By: Malachy Moan M.D.   On: 10/12/2021 13:04  Assessment & Plan:   ASSESSMENT / PLAN:  1. Hypothyroidism r/t Hashimoto's Thyroiditis   -Her previsit thyroid function tests are consistent with appropriate hormone replacement.  She is advised to continue Levothyroxine 88 mcg po daily before breakfast.   - We discussed about correct intake of levothyroxine, at fasting, with water, separated by at least 30 minutes from breakfast, and separated by more than 4 hours from calcium, iron, multivitamins, acid reflux medications (PPIs). -Patient is made aware of the fact that thyroid hormone replacement is needed for life, dose to be adjusted by periodic monitoring of thyroid function tests.   2 Multinodular goiter  Her thyroid ultrasound was consistent with multinodular goiter with 2  of the previous 3 nodules no longer meeting criteria for surveillance.  There is 1 nodule that recommends follow up with [place] in 1-2 years.   3. Glucose management She  reports she has a history of being prediabetic.  No records available from her PCP to review.  I did discuss importance of healthy lifestyle to prevent escalation of glucose to where medications would be needed.  The following Lifestyle Medicine recommendations according to American College of Lifestyle Medicine [facility]) were discussed and offered to patient and she agrees to start the journey:  A. Whole Foods, Plant-based plate comprising of fruits and vegetables, plant-based proteins, whole-grain carbohydrates was discussed in detail with the patient.   A list for source of those nutrients were also provided to the patient.  Patient will use only water or unsweetened tea for hydration. B.  The need to stay away from risky substances including alcohol, smoking; obtaining 7 to 9 hours of restorative sleep, at least 150 minutes of moderate intensity exercise weekly, the importance of healthy social connections,  and stress reduction techniques were discussed. C.  A full color page of  Calorie density of various food groups per pound showing examples of each food groups was provided to the patient.  - The patient admits there is a room for improvement in their diet and drink choices. -  Suggestion is made for the patient to avoid simple carbohydrates from their diet including Cakes, Sweet Desserts / Pastries, Ice Cream, Soda (diet and regular), Sweet Tea, Candies, Chips, Cookies, Sweet Pastries, Store Bought Juices, Alcohol in Excess of 1-2 drinks a day, Artificial Sweeteners, Coffee Creamer, and "Sugar-free" Products. This will help patient to have stable blood glucose profile and potentially avoid unintended weight gain.   - I encouraged the patient to switch to unprocessed or minimally processed complex starch and increased protein intake (animal or plant source), fruits, and vegetables.   - Patient is advised to stick to a routine mealtimes to eat 3 meals a day and avoid unnecessary snacks (to snack only  to correct hypoglycemia).   I spent 30 minutes in the care of the patient today including review of labs from Thyroid Function, CMP, and other relevant labs ; imaging/biopsy records (current and previous including abstractions from other facilities); face-to-face time discussing  her lab results and symptoms, medications doses, her options of short and long term treatment based on the latest standards of care / guidelines;   and documenting the encounter.  Brooklinn Longbottom  participated in the discussions, expressed understanding, and voiced agreement with the above plans.  All questions were answered to her satisfaction. she is encouraged to contact clinic should she have any questions or concerns prior to her return visit.   FOLLOW UP PLAN:  Return in about 1 year (around 11/17/2022) for Thyroid follow up, Previsit labs.  Ronny Bacon, [facility] [address] [place], [place] [address] Phone: [phone] Fax: [phone]  11/16/2021, 12:44 PM

## 2021-11-16 NOTE — Patient Instructions (Signed)

## 2022-10-16 ENCOUNTER — Other Ambulatory Visit: Payer: Self-pay | Admitting: "Endocrinology

## 2022-10-16 DIAGNOSIS — E038 Other specified hypothyroidism: Secondary | ICD-10-CM

## 2022-11-14 LAB — TSH: TSH: 1.65 u[IU]/mL (ref 0.450–4.500)

## 2022-11-14 LAB — T4, FREE: Free T4: 1.38 ng/dL (ref 0.82–1.77)

## 2022-11-18 NOTE — Patient Instructions (Signed)

## 2022-11-19 ENCOUNTER — Ambulatory Visit: Payer: BC Managed Care – PPO | Admitting: Nurse Practitioner

## 2022-11-19 ENCOUNTER — Encounter: Payer: Self-pay | Admitting: Nurse Practitioner

## 2022-11-19 VITALS — BP 121/74 | HR 57 | Ht 67.5 in | Wt 188.4 lb

## 2022-11-19 DIAGNOSIS — E042 Nontoxic multinodular goiter: Secondary | ICD-10-CM

## 2022-11-19 DIAGNOSIS — E038 Other specified hypothyroidism: Secondary | ICD-10-CM | POA: Diagnosis not present

## 2022-11-19 DIAGNOSIS — E063 Autoimmune thyroiditis: Secondary | ICD-10-CM

## 2022-11-19 MED ORDER — LEVOTHYROXINE SODIUM 88 MCG PO TABS: 88.0000 ug | ORAL_TABLET | Freq: Every day | ORAL | 3 refills | Status: DC

## 2022-11-19 NOTE — Progress Notes (Signed)
Endocrinology Follow Up Note                                         11/19/2022, 10:53 AM  Subjective:   Subjective    Debbie Morrison is a 72 y.o.-year-old female patient being seen in follow up after being seen in consultation for hypothyroidism referred by Tacy Learn, FNP.   Past Medical History:  Diagnosis Date   Hypertension    Hypothyroidism     Past Surgical History:  Procedure Laterality Date   KNEE CARTILAGE SURGERY     PARTIAL HYSTERECTOMY      Social History   Socioeconomic History   Marital status: Divorced    Spouse name: Not on file   Number of children: Not on file   Years of education: Not on file   Highest education level: Not on file  Occupational History   Not on file  Tobacco Use   Smoking status: Former    Current packs/day: 0.00    Types: Cigarettes    Quit date: 06/2015    Years since quitting: 7.4   Smokeless tobacco: Never  Vaping Use   Vaping status: Never Used  Substance and Sexual Activity   Alcohol use: Never   Drug use: Never   Sexual activity: Not on file  Other Topics Concern   Not on file  Social History Narrative   Not on file   Social Determinants of Health   Financial Resource Strain: Not on file  Food Insecurity: Not on file  Transportation Needs: Not on file  Physical Activity: Not on file  Stress: Not on file  Social Connections: Not on file    Family History  Problem Relation Age of Onset   Cancer Mother    Thyroid disease Mother    Heart attack Mother     Outpatient Encounter Medications as of 11/19/2022  Medication Sig   acetaminophen (TYLENOL) 500 MG tablet Take 500 mg by mouth as needed.   lisinopril (ZESTRIL) 10 MG tablet Take 10 mg by mouth daily.   Multiple Vitamins-Minerals (CENTRUM ADULT PO) Take by mouth daily.   TREMFYA 100 MG/ML pen Inject 100 mg into the skin. Patient states that she injects every 10 days.    [DISCONTINUED] levothyroxine (SYNTHROID) 88 MCG tablet Take 1 tablet (88 mcg total) by mouth daily before breakfast.   levothyroxine (SYNTHROID) 88 MCG tablet Take 1 tablet (88 mcg total) by mouth daily before breakfast.   No facility-administered encounter medications on file as of 11/19/2022.    ALLERGIES: No Known Allergies VACCINATION STATUS: Immunization History  Administered Date(s) Administered   Moderna Sars-Covid-2 Vaccination 12/17/2019, 01/14/2020     HPI   Debbie Morrison  is a patient with the above medical history. she was diagnosed with hypothyroidism at approximate age of 72 years during a routine physical exam with labs, which required subsequent initiation of thyroid hormone replacement. she was given various doses of Levothyroxine, currently on 88 micrograms. she reports compliance to this medication:  Taking it daily on empty stomach  with water, separated by >30 minutes before breakfast and other medications, and by at least 4 hours from calcium, iron, PPIs, multivitamins .  I reviewed patient's thyroid tests:  Lab Results  Component Value Date   TSH 1.650 11/12/2022   TSH 1.160 11/07/2021   TSH 2.440 05/09/2021   TSH 2.100 01/24/2021   TSH 9.65 (A) 11/21/2020   TSH 20.10 (A) 10/20/2020   TSH 29.70 (A) 08/29/2020   FREET4 1.38 11/12/2022   FREET4 1.49 11/07/2021   FREET4 1.27 05/09/2021   FREET4 1.38 01/24/2021      Pt denies feeling nodules in neck, hoarseness, dysphagia/odynophagia, SOB with lying down.  she does family history of thyroid disorders in her mother (? Tumors on her thyroid) and brother (hypothyroidism).  ? family history of thyroid cancer in her mother.  No history of radiation therapy to head or neck.  No recent use of iodine supplements.  Denies use of Biotin containing supplements.  I reviewed her chart and she also has a history of pre diabetes.   Review of systems  Constitutional: +increasing body weight (has fallen off diet  recently),  current Body mass index is 29.07 kg/m. , no fatigue, no subjective hyperthermia, no subjective hypothermia Eyes: no blurry vision, no xerophthalmia ENT: no sore throat, no nodules palpated in throat, no dysphagia/odynophagia, no hoarseness Cardiovascular: no chest pain, no shortness of breath, no palpitations, no leg swelling Respiratory: no cough, no shortness of breath Gastrointestinal: no nausea/vomiting/diarrhea Musculoskeletal: no muscle/joint aches Skin: no rashes, no hyperemia Neurological: no tremors, no numbness, no tingling, no dizziness Psychiatric: no depression, no anxiety   Objective:   Objective     BP 121/74 (BP Location: Left Arm, Patient Position: Sitting, Cuff Size: Normal)   Pulse (!) 57   Ht 5' 7.5" (1.715 m)   Wt 188 lb 6.4 oz (85.5 kg)   BMI 29.07 kg/m  Wt Readings from Last 3 Encounters:  11/19/22 188 lb 6.4 oz (85.5 kg)  11/16/21 172 lb (78 kg)  05/18/21 195 lb (88.5 kg)    BP Readings from Last 3 Encounters:  11/19/22 121/74  11/16/21 122/73  05/18/21 128/72      Physical Exam- Limited  Constitutional:  Body mass index is 29.07 kg/m. , not in acute distress, normal state of mind Eyes:  EOMI, no exophthalmos Musculoskeletal: no gross deformities, strength intact in all four extremities, no gross restriction of joint movements Skin:  no rashes, no hyperemia Neurological: no tremor with outstretched hands   CMP ( most recent) CMP  No results found for: "NA", "K", "CL", "CO2", "GLUCOSE", "BUN", "CREATININE", "CALCIUM", "PROT", "ALBUMIN", "AST", "ALT", "ALKPHOS", "BILITOT", "GFRNONAA", "GFRAA"   Diabetic Labs (most recent): Lab Results  Component Value Date   HGBA1C 5.9 11/21/2020     Lipid Panel ( most recent) Lipid Panel  No results found for: "CHOL", "TRIG", "HDL", "CHOLHDL", "VLDL", "LDLCALC", "LDLDIRECT", "LABVLDL"     Lab Results  Component Value Date   TSH 1.650 11/12/2022   TSH 1.160 11/07/2021   TSH 2.440  05/09/2021   TSH 2.100 01/24/2021   TSH 9.65 (A) 11/21/2020   TSH 20.10 (A) 10/20/2020   TSH 29.70 (A) 08/29/2020   FREET4 1.38 11/12/2022   FREET4 1.49 11/07/2021   FREET4 1.27 05/09/2021   FREET4 1.38 01/24/2021    10/20/20 Thyroid labs Thyroglobulin antibodies: 1600.6 TPO antibodies: 527 TSH 20.1 FT4-0.66 FT3-2.0   11/21/20 0000   Result status: Final  Resulting  lab: OTHER  Reference range: 0.41 - 5.90  Value: 9.65 Abnormal    Comment: FT4-0.89    Thyroid US from 12/02/20 CLINICAL DATA:  Hypothyroidism   EXAM: THYROID ULTRASOUND   TECHNIQUE: Ultrasound examination of the thyroid gland and adjacent soft tissues was performed.   COMPARISON:  None.   FINDINGS: Parenchymal Echotexture: Moderately heterogeneous   Isthmus: At 0.3 cm   Right lobe: 5.0 x 2.3 x 1.4 cm   Left lobe: 6.6 x 2.8 x 2.2 cm   _________________________________________________________   Estimated total number of nodules >/= 1 cm: 6   Number of spongiform nodules >/=  2 cm not described below (TR1): 0   Number of mixed cystic and solid nodules >/= 1.5 cm not described below (TR2): 0   _________________________________________________________   Nodule # 1:   Location: Right; mid   Maximum size: 1.2 cm; Other 2 dimensions: 1.1 x 1.0 cm   Composition: solid/almost completely solid (2)   Echogenicity: hypoechoic (2)   Shape: not taller-than-wide (0)   Margins: ill-defined (0)   Echogenic foci: macrocalcifications (1)   ACR TI-RADS total points: 5.   ACR TI-RADS risk category: TR4 (4-6 points).   ACR TI-RADS recommendations:   *Given size (>/= 1 - 1.4 cm) and appearance, a follow-up ultrasound in 1 year should be considered based on TI-RADS criteria.   _________________________________________________________   Nodule # 2:   Location: Right; inferior   Maximum size: 1.3 cm; Other 2 dimensions: 0.7 x 0.6 cm   Composition: solid/almost completely solid (2)    Echogenicity: hyperechoic (1)   Shape: not taller-than-wide (0)   Margins: ill-defined (0)   Echogenic foci: none (0)   ACR TI-RADS total points: 3.   ACR TI-RADS risk category: TR3 (3 points).   ACR TI-RADS recommendations:   Given size (<1.4 cm) and appearance, this nodule does NOT meet TI-RADS criteria for biopsy or dedicated follow-up.   _________________________________________________________   Nodule # 3:   Location: Right; Inferior   Maximum size: 1.0 cm; Other 2 dimensions: 0.6 x 0.7 cm   Composition: solid/almost completely solid (2)   Echogenicity: hyperechoic (1)   Shape: not taller-than-wide (0)   Margins: smooth (0)   Echogenic foci: none (0)   ACR TI-RADS total points: 3.   ACR TI-RADS risk category: TR3 (3 points).   ACR TI-RADS recommendations:   Given size (<1.4 cm) and appearance, this nodule does NOT meet TI-RADS criteria for biopsy or dedicated follow-up.   _________________________________________________________   Nodule # 4: 1.8 x 1.2 x 1.6 cm hypoechoic region in the superior left thyroid lobe does not have distinct margins. This is favored to be a pseudo nodule within an area of goiter.   _________________________________________________________   Nodule # 5:   Location: Left; mid   Maximum size: 1.8 cm; Other 2 dimensions: 1.1 x 1.2 cm   Composition: solid/almost completely solid (2)   Echogenicity: hyperechoic (1)   Shape: not taller-than-wide (0)   Margins: ill-defined (0)   Echogenic foci: none (0)   ACR TI-RADS total points: 3.   ACR TI-RADS risk category: TR3 (3 points).   ACR TI-RADS recommendations:   *Given size (>/= 1.5 - 2.4 cm) and appearance, a follow-up ultrasound in 1 year should be considered based on TI-RADS criteria.   _________________________________________________________   Nodule # 6:   Location: Left; inferior   Maximum size: 1.7 cm; Other 2 dimensions: 1.5 x 1.6 cm   Composition:  solid/almost completely solid (2)  Echogenicity: hyperechoic (1)   Shape: not taller-than-wide (0)   Margins: ill-defined (0)   Echogenic foci: none (0)   ACR TI-RADS total points: 3.   ACR TI-RADS risk category: TR3 (3 points).   ACR TI-RADS recommendations:   *Given size (>/= 1.5 - 2.4 cm) and appearance, a follow-up ultrasound in 1 year should be considered based on TI-RADS criteria.   _________________________________________________________   IMPRESSION: Nodules 1, 5, and 6 meet criteria for imaging surveillance.   Annual ultrasound surveillance is recommended until 5 years of stability is documented.   The above is in keeping with the ACR TI-RADS recommendations - J Am Coll Radiol 2017;14:587-595.     Electronically Signed   By: Acquanetta Belling M.D.   On: 12/02/2020 13:40   Thyroid US from 10/11/21 CLINICAL DATA:  Prior ultrasound follow-up. Multiple thyroid nodules under imaging surveillance.   EXAM: THYROID ULTRASOUND   TECHNIQUE: Ultrasound examination of the thyroid gland and adjacent soft tissues was performed.   COMPARISON:  Prior thyroid ultrasound 12/02/2020   FINDINGS: Parenchymal Echotexture: Markedly heterogenous   Isthmus: 0.1 cm   Right lobe: 4.9 x 1.4 x 1.4 cm   Left lobe: 5.2 x 1.9 x 2.2 cm   _________________________________________________________   Estimated total number of nodules >/= 1 cm: 4   Number of spongiform nodules >/=  2 cm not described below (TR1): 0   Number of mixed cystic and solid nodules >/= 1.5 cm not described below (TR2): 0   _________________________________________________________   Markedly heterogeneous thyroid gland. The gland is predominantly hypoechoic with echogenic septations running through it. This imaging appearance is most suggestive of chronic Hashimoto's thyroiditis.   Nodule # 1: Decreased size of dystrophic calcification in the right mid gland. This lesion no longer meets criteria to  warrant continued imaging follow-up at less than 1 cm in size.   Nodule # 6: Echogenic solid nodule along the deep aspect of the left inferior gland measures 1.8 x 1.1 x 1.0 cm, similar compared to 1.7 x 1.5 x 1.4 cm previously. This TR 3 nodule continues to meet criteria for imaging surveillance.   Numerous additional areas of pseudo nodularity and small echogenic nodules all appear improved compared to prior imaging and no longer meet criteria for surveillance.   IMPRESSION: 1. Extremely heterogeneous thyroid gland with an imaging appearance most suggestive of chronic Hashimoto's thyroiditis. 2. Interval involution of 2 of the previously identified nodules (# 1 and # 5 on the prior exam) which no longer meet criteria for imaging surveillance. 3. No interval change in the size or appearance of nodule # 6 in the left inferior gland which continues to meet criteria for imaging surveillance. Recommend follow-up ultrasound in 1-2 years.   The above is in keeping with the ACR TI-RADS recommendations - J Am Coll Radiol 2017;14:587-595.     Electronically Signed   By: Malachy Moan M.D.   On: 10/12/2021 13:04     Latest Reference Range & Units 01/24/21 13:18 05/09/21 13:54 11/07/21 13:36 11/12/22 01:30  TSH 0.450 - 4.500 uIU/mL 2.100 2.440 1.160 1.650  T4,Free(Direct) 0.82 - 1.77 ng/dL 4.09 8.11 9.14 7.82    Assessment & Plan:   ASSESSMENT / PLAN:  1. Hypothyroidism r/t Hashimoto's Thyroiditis   -Her previsit thyroid function tests are consistent with appropriate hormone replacement.  She is advised to continue Levothyroxine 88 mcg po daily before breakfast.   - We discussed about correct intake of levothyroxine, at fasting, with water, separated by at  least 30 minutes from breakfast, and separated by more than 4 hours from calcium, iron, multivitamins, acid reflux medications (PPIs). -Patient is made aware of the fact that thyroid hormone replacement is needed for life,  dose to be adjusted by periodic monitoring of thyroid function tests.   2 Multinodular goiter  Her thyroid ultrasound was consistent with multinodular goiter with 2 of the previous 3 nodules no longer meeting criteria for surveillance.  There is 1 nodule that recommends follow up with [place] in 1-2 years.  I have ordered for her [place] to be done prior to next visit.  3. Glucose management She reports she has a history of being prediabetic.  No records available from her PCP to review.  I did discuss importance of healthy lifestyle to prevent escalation of glucose to where medications would be needed.  The following Lifestyle Medicine recommendations according to American College of Lifestyle Medicine [facility]) were discussed and offered to patient and she agrees to start the journey:  A. Whole Foods, Plant-based plate comprising of fruits and vegetables, plant-based proteins, whole-grain carbohydrates was discussed in detail with the patient.   A list for source of those nutrients were also provided to the patient.  Patient will use only water or unsweetened tea for hydration. B.  The need to stay away from risky substances including alcohol, smoking; obtaining 7 to 9 hours of restorative sleep, at least 150 minutes of moderate intensity exercise weekly, the importance of healthy social connections,  and stress reduction techniques were discussed. C.  A full color page of  Calorie density of various food groups per pound showing examples of each food groups was provided to the patient.  - The patient admits there is a room for improvement in their diet and drink choices. -  Suggestion is made for the patient to avoid simple carbohydrates from their diet including Cakes, Sweet Desserts / Pastries, Ice Cream, Soda (diet and regular), Sweet Tea, Candies, Chips, Cookies, Sweet Pastries, Store Bought Juices, Alcohol in Excess of 1-2 drinks a day, Artificial Sweeteners, Coffee Creamer, and "Sugar-free" Products.  This will help patient to have stable blood glucose profile and potentially avoid unintended weight gain.   - I encouraged the patient to switch to unprocessed or minimally processed complex starch and increased protein intake (animal or plant source), fruits, and vegetables.   - Patient is advised to stick to a routine mealtimes to eat 3 meals a day and avoid unnecessary snacks (to snack only to correct hypoglycemia).    I spent  25  minutes in the care of the patient today including review of labs from Thyroid Function, CMP, and other relevant labs ; imaging/biopsy records (current and previous including abstractions from other facilities); face-to-face time discussing  her lab results and symptoms, medications doses, her options of short and long term treatment based on the latest standards of care / guidelines;   and documenting the encounter.  Chrystal Zeimet  participated in the discussions, expressed understanding, and voiced agreement with the above plans.  All questions were answered to her satisfaction. she is encouraged to contact clinic should she have any questions or concerns prior to her return visit.   FOLLOW UP PLAN:  Return in about 1 year (around 11/19/2023) for Thyroid follow up, Previsit labs, thyroid ultrasound.  Ronny Bacon, [facility] [address] [place], [place] [address] Phone: [phone] Fax: [phone]  11/19/2022, 10:53 AM

## 2022-12-28 ENCOUNTER — Ambulatory Visit (HOSPITAL_COMMUNITY)
Admission: RE | Admit: 2022-12-28 | Discharge: 2022-12-28 | Disposition: A | Discharge status: Home / Self Care | Payer: BC Managed Care – PPO | Source: Ambulatory Visit | Attending: Nurse Practitioner | Admitting: Nurse Practitioner

## 2022-12-28 DIAGNOSIS — E038 Other specified hypothyroidism: Secondary | ICD-10-CM | POA: Insufficient documentation

## 2022-12-28 DIAGNOSIS — E042 Nontoxic multinodular goiter: Secondary | ICD-10-CM | POA: Insufficient documentation

## 2022-12-28 DIAGNOSIS — E063 Autoimmune thyroiditis: Secondary | ICD-10-CM | POA: Diagnosis present

## 2023-01-01 ENCOUNTER — Encounter: Payer: Self-pay | Admitting: Nurse Practitioner

## 2023-09-11 ENCOUNTER — Telehealth: Payer: Self-pay | Admitting: Nurse Practitioner

## 2023-09-11 NOTE — Telephone Encounter (Signed)
 She wont need to repeat the ultrasound before her appt in July.  She just had that done back in August 2024.  I had sent her a message going over the results and that we may not need to do any more imaging studies in the future.  She just needs her labs done.

## 2023-09-11 NOTE — Telephone Encounter (Signed)
 Labs need to be updated and mailed

## 2023-09-11 NOTE — Telephone Encounter (Signed)
 Pt called and stated she needed the paperwork for her xray/ultrasound?  Looks like she did one and I don't see new orders put in.  Does she need something else done before her appt or just labs?

## 2023-09-11 NOTE — Telephone Encounter (Signed)
 Called and let pt know

## 2023-09-12 ENCOUNTER — Other Ambulatory Visit: Payer: Self-pay | Admitting: *Deleted

## 2023-09-12 DIAGNOSIS — E063 Autoimmune thyroiditis: Secondary | ICD-10-CM

## 2023-09-12 DIAGNOSIS — E042 Nontoxic multinodular goiter: Secondary | ICD-10-CM

## 2023-09-12 NOTE — Telephone Encounter (Signed)
 Labs are updated and mailed to the patient.

## 2023-11-07 ENCOUNTER — Other Ambulatory Visit: Payer: Self-pay | Admitting: *Deleted

## 2023-11-07 DIAGNOSIS — E042 Nontoxic multinodular goiter: Secondary | ICD-10-CM

## 2023-11-07 DIAGNOSIS — E063 Autoimmune thyroiditis: Secondary | ICD-10-CM

## 2023-11-08 LAB — T4, FREE
Free T4: 1.32 ng/dL
TSH: 1.37 (ref 0.41–5.90)

## 2023-11-18 ENCOUNTER — Encounter: Payer: Self-pay | Admitting: Nurse Practitioner

## 2023-11-18 ENCOUNTER — Ambulatory Visit: Payer: BC Managed Care – PPO | Admitting: Nurse Practitioner

## 2023-11-18 VITALS — BP 112/64 | HR 97 | Ht 67.5 in | Wt 208.8 lb

## 2023-11-18 DIAGNOSIS — E042 Nontoxic multinodular goiter: Secondary | ICD-10-CM

## 2023-11-18 DIAGNOSIS — E063 Autoimmune thyroiditis: Secondary | ICD-10-CM

## 2023-11-18 MED ORDER — LEVOTHYROXINE SODIUM 88 MCG PO TABS: 88.0000 ug | ORAL_TABLET | Freq: Every day | ORAL | 3 refills | Status: AC

## 2023-11-18 NOTE — Progress Notes (Signed)
 Endocrinology Follow Up Note                                         11/18/2023, 10:04 AM  Subjective:   Subjective    Debbie Morrison is a 73 y.o.-year-old female patient being seen in follow up after being seen in consultation for hypothyroidism referred by Erskine Neptune, FNP.   Past Medical History:  Diagnosis Date   Hypertension    Hypothyroidism     Past Surgical History:  Procedure Laterality Date   KNEE CARTILAGE SURGERY     PARTIAL HYSTERECTOMY      Social History   Socioeconomic History   Marital status: Divorced    Spouse name: Not on file   Number of children: Not on file   Years of education: Not on file   Highest education level: Not on file  Occupational History   Not on file  Tobacco Use   Smoking status: Former    Current packs/day: 0.00    Types: Cigarettes    Quit date: 06/2015    Years since quitting: 8.4   Smokeless tobacco: Never  Vaping Use   Vaping status: Never Used  Substance and Sexual Activity   Alcohol use: Never   Drug use: Never   Sexual activity: Not on file  Other Topics Concern   Not on file  Social History Narrative   Not on file   Social Drivers of Health   Financial Resource Strain: Not on file  Food Insecurity: Not on file  Transportation Needs: Not on file  Physical Activity: Not on file  Stress: Not on file  Social Connections: Not on file    Family History  Problem Relation Age of Onset   Cancer Mother    Thyroid  disease Mother    Heart attack Mother     Outpatient Encounter Medications as of 11/18/2023  Medication Sig   acetaminophen (TYLENOL) 500 MG tablet Take 500 mg by mouth as needed.   lisinopril (ZESTRIL) 10 MG tablet Take 10 mg by mouth daily.   TREMFYA 100 MG/ML pen Inject 100 mg into the skin. Patient states that she injects every 10 days.   Vitamin D, Ergocalciferol, (DRISDOL) 1.25 MG (50000 UNIT) CAPS capsule Take 50,000  Units by mouth once a week.   [DISCONTINUED] levothyroxine  (SYNTHROID ) 88 MCG tablet Take 1 tablet (88 mcg total) by mouth daily before breakfast.   levothyroxine  (SYNTHROID ) 88 MCG tablet Take 1 tablet (88 mcg total) by mouth daily before breakfast.   Multiple Vitamins-Minerals (CENTRUM ADULT PO) Take by mouth daily. (Patient not taking: Reported on 11/18/2023)   No facility-administered encounter medications on file as of 11/18/2023.    ALLERGIES: Allergies  Allergen Reactions   Nystatin Other (See Comments)    Burning sensation to the area it was applied.   VACCINATION STATUS: Immunization History  Administered Date(s) Administered   Moderna Sars-Covid-2 Vaccination 12/17/2019, 01/14/2020     HPI   Debbie Morrison  is a patient with the above  medical history. she was diagnosed with hypothyroidism at approximate age of 42 years during a routine physical exam with labs, which required subsequent initiation of thyroid  hormone replacement. she was given various doses of Levothyroxine , currently on 88 micrograms. she reports compliance to this medication:  Taking it daily on empty stomach  with water, separated by >30 minutes before breakfast and other medications, and by at least 4 hours from calcium, iron, PPIs, multivitamins .  I reviewed patient's thyroid  tests:  Lab Results  Component Value Date   TSH 1.37 11/08/2023   TSH 1.650 11/12/2022   TSH 1.160 11/07/2021   TSH 2.440 05/09/2021   TSH 2.100 01/24/2021   TSH 9.65 (A) 11/21/2020   TSH 20.10 (A) 10/20/2020   TSH 29.70 (A) 08/29/2020   FREET4 1.32 11/08/2023   FREET4 1.38 11/12/2022   FREET4 1.49 11/07/2021   FREET4 1.27 05/09/2021   FREET4 1.38 01/24/2021      Pt denies feeling nodules in neck, hoarseness, dysphagia/odynophagia, SOB with lying down.  she does family history of thyroid  disorders in her mother (? Tumors on her thyroid ) and brother (hypothyroidism).  ? family history of thyroid  cancer in her mother.  No  history of radiation therapy to head or neck.  No recent use of iodine supplements.  Denies use of Biotin containing supplements.  I reviewed her chart and she also has a history of pre diabetes.   Review of systems  Constitutional: +increasing body weight (has fallen off diet recently),  current Body mass index is 32.22 kg/m. , no fatigue, no subjective hyperthermia, no subjective hypothermia Eyes: no blurry vision, no xerophthalmia ENT: no sore throat, no nodules palpated in throat, no dysphagia/odynophagia, no hoarseness Cardiovascular: no chest pain, no shortness of breath, no palpitations, no leg swelling Respiratory: no cough, no shortness of breath Gastrointestinal: no nausea/vomiting/diarrhea Musculoskeletal: no muscle/joint aches Skin: no rashes, no hyperemia Neurological: no tremors, no numbness, no tingling, no dizziness Psychiatric: no depression, no anxiety   Objective:   Objective     BP 112/64 (BP Location: Left Arm, Patient Position: Sitting, Cuff Size: Large)   Pulse 97   Ht 5' 7.5 (1.715 m)   Wt 208 lb 12.8 oz (94.7 kg)   BMI 32.22 kg/m  Wt Readings from Last 3 Encounters:  11/18/23 208 lb 12.8 oz (94.7 kg)  11/19/22 188 lb 6.4 oz (85.5 kg)  11/16/21 172 lb (78 kg)    BP Readings from Last 3 Encounters:  11/18/23 112/64  11/19/22 121/74  11/16/21 122/73      Physical Exam- Limited  Constitutional:  Body mass index is 32.22 kg/m. , not in acute distress, normal state of mind Eyes:  EOMI, no exophthalmos Musculoskeletal: no gross deformities, strength intact in all four extremities, no gross restriction of joint movements Skin:  no rashes, no hyperemia Neurological: no tremor with outstretched hands   CMP ( most recent) CMP  No results found for: NA, K, CL, CO2, GLUCOSE, BUN, CREATININE, CALCIUM, PROT, ALBUMIN, AST, ALT, ALKPHOS, BILITOT, GFRNONAA, GFRAA   Diabetic Labs (most recent): Lab Results  Component  Value Date   HGBA1C 5.9 11/21/2020     Lipid Panel ( most recent) Lipid Panel  No results found for: CHOL, TRIG, HDL, CHOLHDL, VLDL, LDLCALC, LDLDIRECT, LABVLDL     Lab Results  Component Value Date   TSH 1.37 11/08/2023   TSH 1.650 11/12/2022   TSH 1.160 11/07/2021   TSH 2.440 05/09/2021   TSH 2.100 01/24/2021   TSH  9.65 (A) 11/21/2020   TSH 20.10 (A) 10/20/2020   TSH 29.70 (A) 08/29/2020   FREET4 1.32 11/08/2023   FREET4 1.38 11/12/2022   FREET4 1.49 11/07/2021   FREET4 1.27 05/09/2021   FREET4 1.38 01/24/2021    10/20/20 Thyroid  labs Thyroglobulin antibodies: 1600.6 TPO antibodies: 527 TSH 20.1 FT4-0.66 FT3-2.0   11/21/20 0000   Result status: Final  Resulting lab: OTHER  Reference range: 0.41 - 5.90  Value: 9.65 Abnormal    Comment: FT4-0.89    Thyroid  Us  from 12/02/20 CLINICAL DATA:  Hypothyroidism   EXAM: THYROID  ULTRASOUND   TECHNIQUE: Ultrasound examination of the thyroid  gland and adjacent soft tissues was performed.   COMPARISON:  None.   FINDINGS: Parenchymal Echotexture: Moderately heterogeneous   Isthmus: At 0.3 cm   Right lobe: 5.0 x 2.3 x 1.4 cm   Left lobe: 6.6 x 2.8 x 2.2 cm   _________________________________________________________   Estimated total number of nodules >/= 1 cm: 6   Number of spongiform nodules >/=  2 cm not described below (TR1): 0   Number of mixed cystic and solid nodules >/= 1.5 cm not described below (TR2): 0   _________________________________________________________   Nodule # 1:   Location: Right; mid   Maximum size: 1.2 cm; Other 2 dimensions: 1.1 x 1.0 cm   Composition: solid/almost completely solid (2)   Echogenicity: hypoechoic (2)   Shape: not taller-than-wide (0)   Margins: ill-defined (0)   Echogenic foci: macrocalcifications (1)   ACR TI-RADS total points: 5.   ACR TI-RADS risk category: TR4 (4-6 points).   ACR TI-RADS recommendations:   *Given size (>/= 1 -  1.4 cm) and appearance, a follow-up ultrasound in 1 year should be considered based on TI-RADS criteria.   _________________________________________________________   Nodule # 2:   Location: Right; inferior   Maximum size: 1.3 cm; Other 2 dimensions: 0.7 x 0.6 cm   Composition: solid/almost completely solid (2)   Echogenicity: hyperechoic (1)   Shape: not taller-than-wide (0)   Margins: ill-defined (0)   Echogenic foci: none (0)   ACR TI-RADS total points: 3.   ACR TI-RADS risk category: TR3 (3 points).   ACR TI-RADS recommendations:   Given size (<1.4 cm) and appearance, this nodule does NOT meet TI-RADS criteria for biopsy or dedicated follow-up.   _________________________________________________________   Nodule # 3:   Location: Right; Inferior   Maximum size: 1.0 cm; Other 2 dimensions: 0.6 x 0.7 cm   Composition: solid/almost completely solid (2)   Echogenicity: hyperechoic (1)   Shape: not taller-than-wide (0)   Margins: smooth (0)   Echogenic foci: none (0)   ACR TI-RADS total points: 3.   ACR TI-RADS risk category: TR3 (3 points).   ACR TI-RADS recommendations:   Given size (<1.4 cm) and appearance, this nodule does NOT meet TI-RADS criteria for biopsy or dedicated follow-up.   _________________________________________________________   Nodule # 4: 1.8 x 1.2 x 1.6 cm hypoechoic region in the superior left thyroid  lobe does not have distinct margins. This is favored to be a pseudo nodule within an area of goiter.   _________________________________________________________   Nodule # 5:   Location: Left; mid   Maximum size: 1.8 cm; Other 2 dimensions: 1.1 x 1.2 cm   Composition: solid/almost completely solid (2)   Echogenicity: hyperechoic (1)   Shape: not taller-than-wide (0)   Margins: ill-defined (0)   Echogenic foci: none (0)   ACR TI-RADS total points: 3.   ACR TI-RADS risk category:  TR3 (3 points).   ACR TI-RADS  recommendations:   *Given size (>/= 1.5 - 2.4 cm) and appearance, a follow-up ultrasound in 1 year should be considered based on TI-RADS criteria.   _________________________________________________________   Nodule # 6:   Location: Left; inferior   Maximum size: 1.7 cm; Other 2 dimensions: 1.5 x 1.6 cm   Composition: solid/almost completely solid (2)   Echogenicity: hyperechoic (1)   Shape: not taller-than-wide (0)   Margins: ill-defined (0)   Echogenic foci: none (0)   ACR TI-RADS total points: 3.   ACR TI-RADS risk category: TR3 (3 points).   ACR TI-RADS recommendations:   *Given size (>/= 1.5 - 2.4 cm) and appearance, a follow-up ultrasound in 1 year should be considered based on TI-RADS criteria.   _________________________________________________________   IMPRESSION: Nodules 1, 5, and 6 meet criteria for imaging surveillance.   Annual ultrasound surveillance is recommended until 5 years of stability is documented.   The above is in keeping with the ACR TI-RADS recommendations - J Am Coll Radiol 2017;14:587-595.     Electronically Signed   By: Aliene Lloyd M.D.   On: 12/02/2020 13:40   Thyroid  US  from 10/11/21 CLINICAL DATA:  Prior ultrasound follow-up. Multiple thyroid  nodules under imaging surveillance.   EXAM: THYROID  ULTRASOUND   TECHNIQUE: Ultrasound examination of the thyroid  gland and adjacent soft tissues was performed.   COMPARISON:  Prior thyroid  ultrasound 12/02/2020   FINDINGS: Parenchymal Echotexture: Markedly heterogenous   Isthmus: 0.1 cm   Right lobe: 4.9 x 1.4 x 1.4 cm   Left lobe: 5.2 x 1.9 x 2.2 cm   _________________________________________________________   Estimated total number of nodules >/= 1 cm: 4   Number of spongiform nodules >/=  2 cm not described below (TR1): 0   Number of mixed cystic and solid nodules >/= 1.5 cm not described below (TR2): 0    _________________________________________________________   Markedly heterogeneous thyroid  gland. The gland is predominantly hypoechoic with echogenic septations running through it. This imaging appearance is most suggestive of chronic Hashimoto's thyroiditis.   Nodule # 1: Decreased size of dystrophic calcification in the right mid gland. This lesion no longer meets criteria to warrant continued imaging follow-up at less than 1 cm in size.   Nodule # 6: Echogenic solid nodule along the deep aspect of the left inferior gland measures 1.8 x 1.1 x 1.0 cm, similar compared to 1.7 x 1.5 x 1.4 cm previously. This TR 3 nodule continues to meet criteria for imaging surveillance.   Numerous additional areas of pseudo nodularity and small echogenic nodules all appear improved compared to prior imaging and no longer meet criteria for surveillance.   IMPRESSION: 1. Extremely heterogeneous thyroid  gland with an imaging appearance most suggestive of chronic Hashimoto's thyroiditis. 2. Interval involution of 2 of the previously identified nodules (# 1 and # 5 on the prior exam) which no longer meet criteria for imaging surveillance. 3. No interval change in the size or appearance of nodule # 6 in the left inferior gland which continues to meet criteria for imaging surveillance. Recommend follow-up ultrasound in 1-2 years.   The above is in keeping with the ACR TI-RADS recommendations - J Am Coll Radiol 2017;14:587-595.     Electronically Signed   By: Wilkie Lent M.D.   On: 10/12/2021 13:04 ----------------------------------------------------------------------------------------------------------  Thyroid  US  from 12/28/22 CLINICAL DATA:  Prior ultrasound follow-up. Nodule # 6 in the left inferior gland currently under imaging surveillance.   EXAM: THYROID  ULTRASOUND  TECHNIQUE: Ultrasound examination of the thyroid  gland and adjacent soft tissues was performed.   COMPARISON:   Prior thyroid  ultrasound 10/11/2021; 12/02/2020   FINDINGS: Parenchymal Echotexture: Markedly heterogenous   Isthmus: 0.3 cm   Right lobe: 5.4 x 1.8 x 1.5 cm   Left lobe: 6.1 x 2.1 x 2.5 cm   _________________________________________________________   Estimated total number of nodules >/= 1 cm: 1   Number of spongiform nodules >/=  2 cm not described below (TR1): 0   Number of mixed cystic and solid nodules >/= 1.5 cm not described below (TR2): 0   _________________________________________________________   Similar appearance of diffusely heterogeneous thyroid  gland. The thyroid  parenchyma is largely hypoechoic interspersed with areas of echogenic septation. As before, this imaging appearance is consistent with the known clinical history of chronic Hashimoto's thyroiditis.   Stable isolated dystrophic calcification in the right mid gland. No further follow-up.   Circumscribed anechoic but ill-defined lesion in the left aspect of the thyroid  isthmus measures grossly 1.4 x 1.2 x 0.6 cm. This is favored to represent a minimally complex cyst. This nodule does NOT meet TI-RADS criteria for biopsy or dedicated follow-up.   Nodule # 6: Solid hyperechoic nodule in the left lower gland measures 1.5 x 1.3 x 0.9 cm. This is slightly smaller compared to 1.8 x 1.1 x 1.0 cm previously. No suspicious features.   IMPRESSION: 1. Decreasing size of hyperechoic solid nodule in the left lower gland now measuring only up to 1.5 cm. Involution over time is consistent with a benign process. 2. Similar appearance of chronic Hashimoto's thyroiditis. 3. No new nodules or suspicious features.   The above is in keeping with the ACR TI-RADS recommendations - J Am Coll Radiol 2017;14:587-595.     Electronically Signed   By: Wilkie Lent M.D.   On: 12/29/2022 06:32    Latest Reference Range & Units 11/07/21 13:36 11/12/22 01:30 11/08/23 00:00  TSH 0.41 - 5.90  1.160 1.650 1.37   T4,Free(Direct) ng/dL 8.50 8.61 8.67    Assessment & Plan:   ASSESSMENT / PLAN:  1. Hypothyroidism r/t Hashimoto's Thyroiditis  -Her previsit thyroid  function tests are consistent with appropriate hormone replacement.  She is advised to continue Levothyroxine  88 mcg po daily before breakfast.   - We discussed about correct intake of levothyroxine , at fasting, with water, separated by at least 30 minutes from breakfast, and separated by more than 4 hours from calcium, iron, multivitamins, acid reflux medications (PPIs). -Patient is made aware of the fact that thyroid  hormone replacement is needed for life, dose to be adjusted by periodic monitoring of thyroid  function tests.  2. Multinodular goiter  Her thyroid  ultrasound was consistent with multinodular goiter with 2 of the previous 3 nodules no longer meeting criteria for surveillance.  There is 1 nodule that recommends follow up with US  in 1-2 years.  I have ordered for her US  to be done prior to next visit.  Her repeat thyroid  ultrasound from 12/28/22 shows nodule that has shrunken since last exam, favoring benignity, thus discontinuing the need for annual surveillance imaging.  3. Glucose management She reports she has a history of being prediabetic.  No records available from her PCP to review.  I did discuss importance of healthy lifestyle to prevent escalation of glucose to where medications would be needed.  She admits she has not been eating as healthy as she should recently.  The following Lifestyle Medicine recommendations according to [facility] Lifestyle [facility]) were  discussed and offered to patient and she agrees to start the journey:  A. Whole Foods, Plant-based plate comprising of fruits and vegetables, plant-based proteins, whole-grain carbohydrates was discussed in detail with the patient.   A list for source of those nutrients were also provided to the patient.  Patient will use only water or unsweetened  tea for hydration. B.  The need to stay away from risky substances including alcohol, smoking; obtaining 7 to 9 hours of restorative sleep, at least 150 minutes of moderate intensity exercise weekly, the importance of healthy social connections,  and stress reduction techniques were discussed. C.  A full color page of  Calorie density of various food groups per pound showing examples of each food groups was provided to the patient.  - Nutritional counseling repeated at each appointment due to patients tendency to fall back in to old habits.  - The patient admits there is a room for improvement in their diet and drink choices. -  Suggestion is made for the patient to avoid simple carbohydrates from their diet including Cakes, Sweet Desserts / Pastries, Ice Cream, Soda (diet and regular), Sweet Tea, Candies, Chips, Cookies, Sweet Pastries, Store Bought Juices, Alcohol in Excess of 1-2 drinks a day, Artificial Sweeteners, Coffee Creamer, and Sugar-free Products. This will help patient to have stable blood glucose profile and potentially avoid unintended weight gain.   - I encouraged the patient to switch to unprocessed or minimally processed complex starch and increased protein intake (animal or plant source), fruits, and vegetables.   - Patient is advised to stick to a routine mealtimes to eat 3 meals a day and avoid unnecessary snacks (to snack only to correct hypoglycemia).     I spent  26  minutes in the care of the patient today including review of labs from Thyroid  Function, CMP, and other relevant labs ; imaging/biopsy records (current and previous including abstractions from other facilities); face-to-face time discussing  her lab results and symptoms, medications doses, her options of short and long term treatment based on the latest standards of care / guidelines;   and documenting the encounter.  Maryln Ricci  participated in the discussions, expressed understanding, and voiced agreement  with the above plans.  All questions were answered to her satisfaction. she is encouraged to contact clinic should she have any questions or concerns prior to her return visit.   FOLLOW UP PLAN:  Return in about 1 year (around 11/17/2024) for Thyroid  follow up, Previsit labs.  Benton Rio, [facility] [address] [place], [place] [address] Phone: [phone] Fax: [phone]  11/18/2023, 10:04 AM

## 2023-11-18 NOTE — Patient Instructions (Signed)

## 2024-01-20 IMAGING — US US THYROID
1 series · 13 of 25 positions shown · non-contrast
Comparison: Prior thyroid ultrasound 12/02/2020

CLINICAL DATA: Prior ultrasound follow-up. Multiple thyroid nodules
under imaging surveillance.

EXAM:
THYROID ULTRASOUND
TECHNIQUE: Ultrasound examination of the thyroid gland and adjacent soft
tissues was performed.

[Series 1: us thyroid · 13 of 95 slices shown]
[im 1/95]
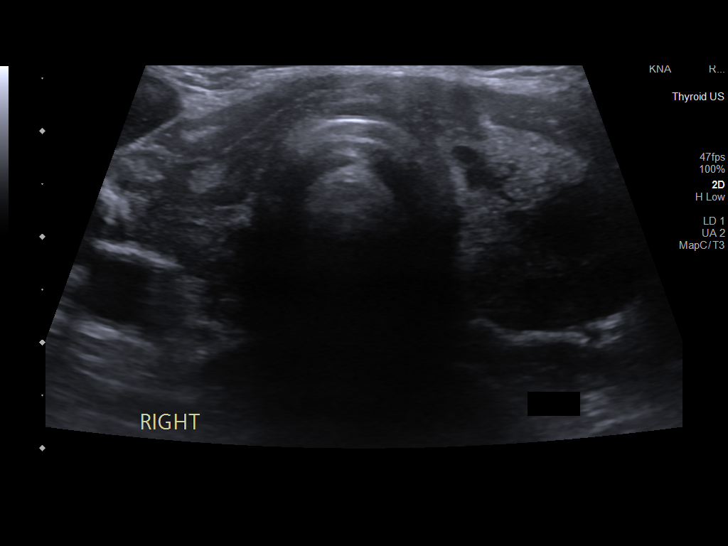
[im 8/95]
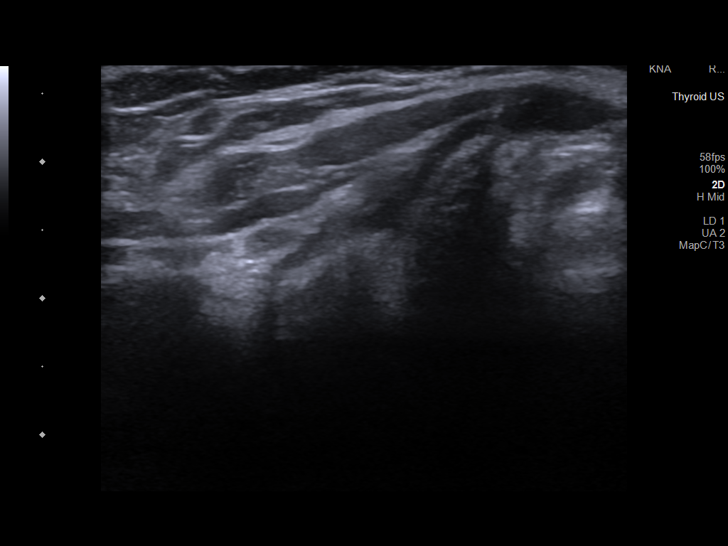
[im 16/95]
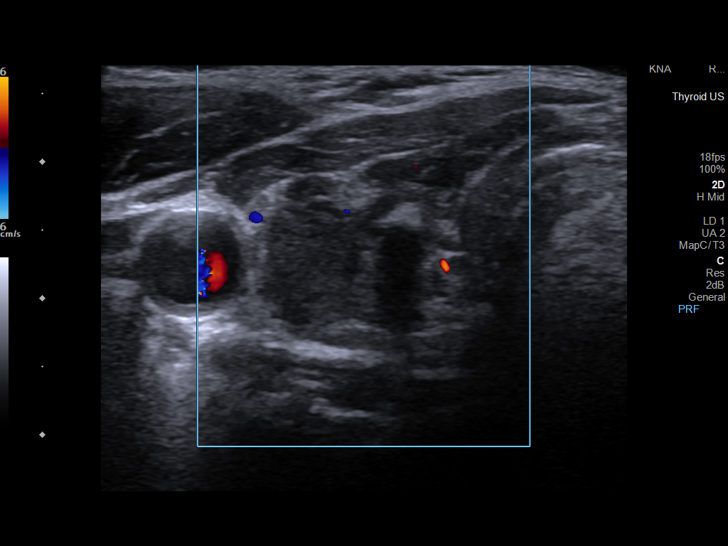
[im 24/95]
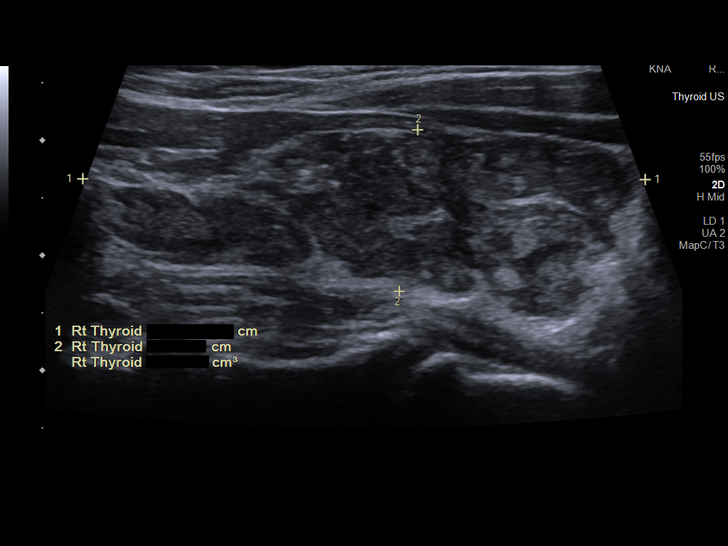
[im 32/95]
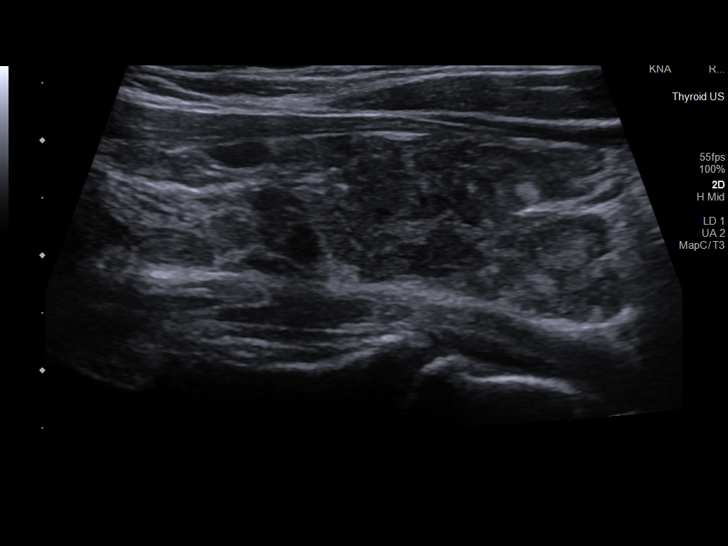
[im 40/95]
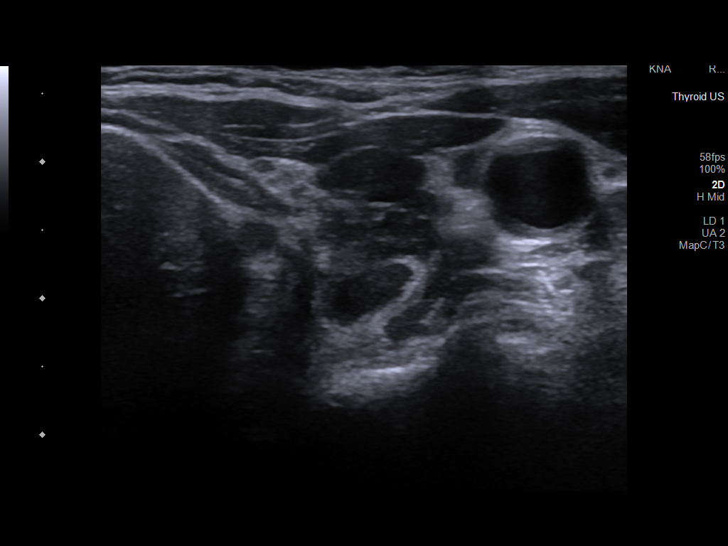
[im 48/95]
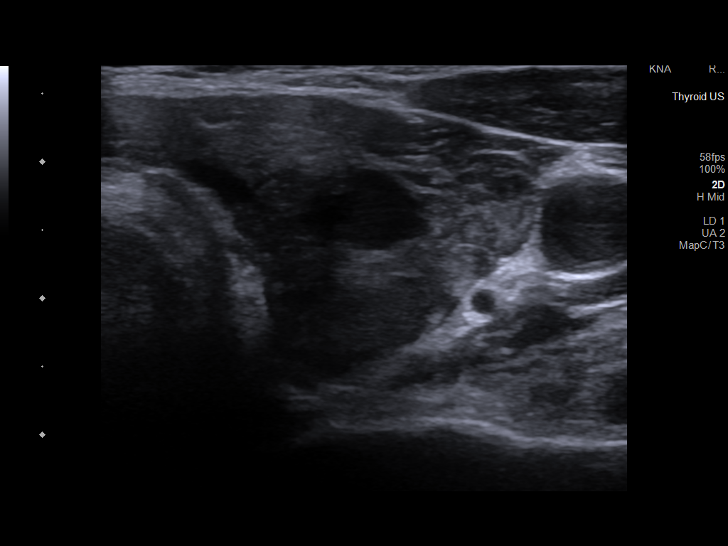
[im 55/95]
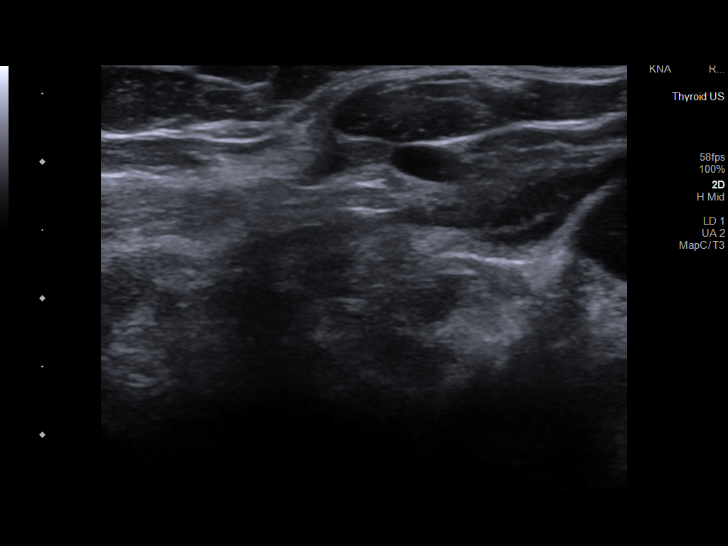
[im 63/95]
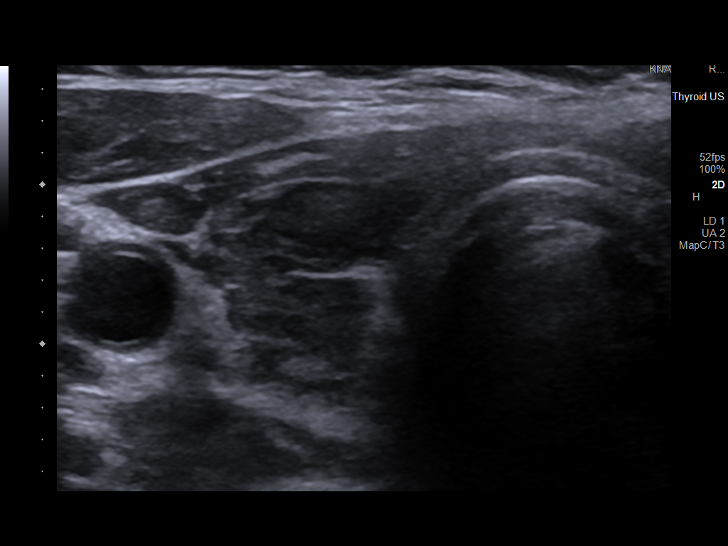
[im 71/95]
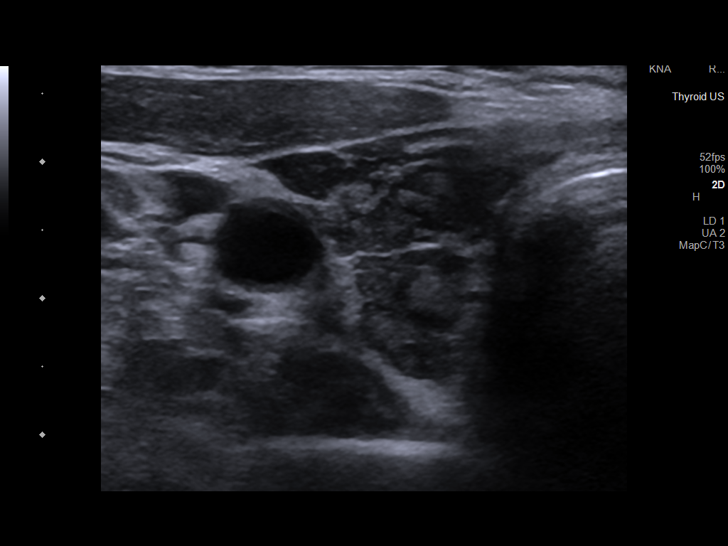
[im 79/95]
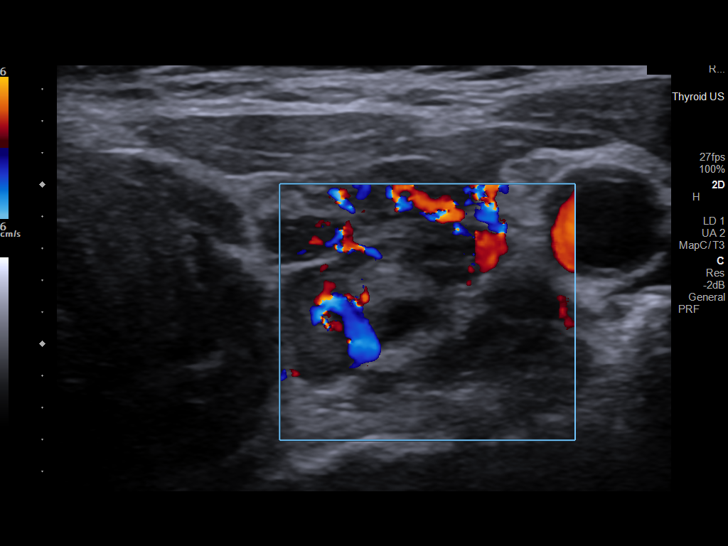
[im 87/95]
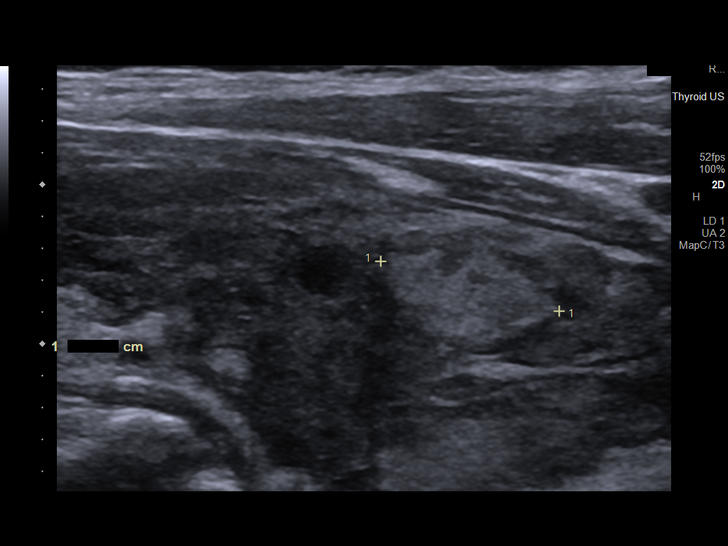
[im 95/95]
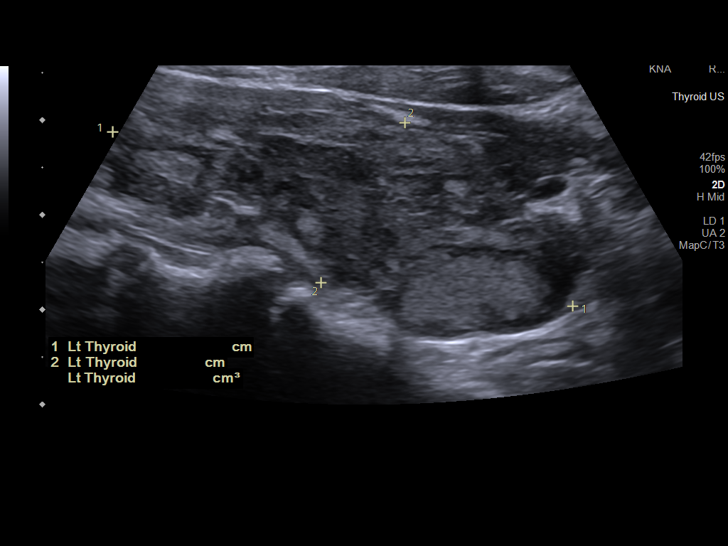

[13 of 25 positions shown; findings below may reference images not displayed]

FINDINGS: Parenchymal Echotexture: Markedly heterogenous

Isthmus: 0.1 cm

Right lobe: 4.9 x 1.4 x 1.4 cm

Left lobe: 5.2 x 1.9 x 2.2 cm

_________________________________________________________

Estimated total number of nodules >/= 1 cm: 4

Number of spongiform nodules >/=  2 cm not described below (TR1): 0

Number of mixed cystic and solid nodules >/= 1.5 cm not described
below (TR2): 0

_________________________________________________________

Markedly heterogeneous thyroid gland. The gland is predominantly
hypoechoic with echogenic septations running through it. This
imaging appearance is most suggestive of chronic Hashimoto's
thyroiditis.

Nodule # 1: Decreased size of dystrophic calcification in the right
mid gland. This lesion no longer meets criteria to warrant continued
imaging follow-up at less than 1 cm in size.

Nodule # 6: Echogenic solid nodule along the deep aspect of the left
inferior gland measures 1.8 x 1.1 x 1.0 cm, similar compared to
x 1.5 x 1.4 cm previously. This TR 3 nodule continues to meet
criteria for imaging surveillance.

Numerous additional areas of pseudo nodularity and small echogenic
nodules all appear improved compared to prior imaging and no longer
meet criteria for surveillance.
IMPRESSION: 1. Extremely heterogeneous thyroid gland with an imaging appearance
most suggestive of chronic Hashimoto's thyroiditis.
2. Interval involution of 2 of the previously identified nodules (#
1 and # 5 on the prior exam) which no longer meet criteria for
imaging surveillance.
3. No interval change in the size or appearance of nodule # 6 in the
left inferior gland which continues to meet criteria for imaging
surveillance. Recommend follow-up ultrasound in 1-2 years.

The above is in keeping with the ACR TI-RADS recommendations - [HOSPITAL] 0759;[DATE].

## 2024-11-17 ENCOUNTER — Ambulatory Visit: Admitting: Nurse Practitioner
